# Patient Record
Sex: Female | Born: 1955 | Race: Black or African American | Hispanic: No | Marital: Single | Smoking: Never smoker
Health system: Southern US, Community
[De-identification: ages and names within clinical notes are randomized; demographics above are authoritative.]

## PROBLEM LIST (undated history)

## (undated) DIAGNOSIS — R52 Pain, unspecified: Secondary | ICD-10-CM

## (undated) DIAGNOSIS — R079 Chest pain, unspecified: Secondary | ICD-10-CM

## (undated) DIAGNOSIS — J45909 Unspecified asthma, uncomplicated: Secondary | ICD-10-CM

## (undated) DIAGNOSIS — I2692 Saddle embolus of pulmonary artery without acute cor pulmonale: Secondary | ICD-10-CM

## (undated) DIAGNOSIS — E041 Nontoxic single thyroid nodule: Secondary | ICD-10-CM

## (undated) DIAGNOSIS — H409 Unspecified glaucoma: Secondary | ICD-10-CM

## (undated) DIAGNOSIS — R569 Unspecified convulsions: Secondary | ICD-10-CM

## (undated) DIAGNOSIS — R0602 Shortness of breath: Secondary | ICD-10-CM

## (undated) DIAGNOSIS — M1990 Unspecified osteoarthritis, unspecified site: Secondary | ICD-10-CM

## (undated) DIAGNOSIS — E669 Obesity, unspecified: Secondary | ICD-10-CM

## (undated) HISTORY — DX: Unspecified glaucoma: H40.9

## (undated) HISTORY — DX: Pain, unspecified: R52

## (undated) HISTORY — DX: Nontoxic single thyroid nodule: E04.1

## (undated) HISTORY — DX: Shortness of breath: R06.02

## (undated) HISTORY — DX: Saddle embolus of pulmonary artery without acute cor pulmonale: I26.92

## (undated) HISTORY — DX: Unspecified osteoarthritis, unspecified site: M19.90

## (undated) HISTORY — DX: Obesity, unspecified: E66.9

## (undated) HISTORY — DX: Chest pain, unspecified: R07.9

## (undated) HISTORY — DX: Unspecified asthma, uncomplicated: J45.909

## (undated) HISTORY — DX: Unspecified convulsions: R56.9

---

## 2005-01-13 ENCOUNTER — Other Ambulatory Visit: Admission: RE | Admit: 2005-01-13 | Discharge: 2005-01-13 | Payer: Self-pay | Admitting: Obstetrics and Gynecology

## 2008-07-23 ENCOUNTER — Ambulatory Visit (HOSPITAL_COMMUNITY): Admission: RE | Admit: 2008-07-23 | Discharge: 2008-07-23 | Payer: Self-pay | Admitting: Obstetrics & Gynecology

## 2012-06-07 ENCOUNTER — Ambulatory Visit (INDEPENDENT_AMBULATORY_CARE_PROVIDER_SITE_OTHER): Payer: PRIVATE HEALTH INSURANCE | Admitting: Physician Assistant

## 2012-06-07 VITALS — BP 118/84 | HR 93 | Temp 98.0°F | Resp 18 | Ht 64.5 in | Wt 222.0 lb

## 2012-06-07 DIAGNOSIS — H5440 Blindness, one eye, unspecified eye: Secondary | ICD-10-CM

## 2012-06-07 DIAGNOSIS — M25559 Pain in unspecified hip: Secondary | ICD-10-CM

## 2012-06-07 DIAGNOSIS — M25569 Pain in unspecified knee: Secondary | ICD-10-CM

## 2012-06-07 DIAGNOSIS — H409 Unspecified glaucoma: Secondary | ICD-10-CM | POA: Insufficient documentation

## 2012-06-07 DIAGNOSIS — R51 Headache: Secondary | ICD-10-CM

## 2012-06-07 DIAGNOSIS — R110 Nausea: Secondary | ICD-10-CM

## 2012-06-07 HISTORY — DX: Unspecified glaucoma: H40.9

## 2012-06-07 LAB — POCT CBC
HCT, POC: 42.9 % (ref 37.7–47.9)
Lymph, poc: 2.4 (ref 0.6–3.4)
MCHC: 30.1 g/dL — AB (ref 31.8–35.4)
MCV: 78.8 fL — AB (ref 80–97)
MID (cbc): 0.85 (ref 0–0.9)
POC Granulocyte: 4.7 (ref 2–6.9)
POC LYMPH PERCENT: 31.7 %L (ref 10–50)
Platelet Count, POC: 270 10*3/uL (ref 142–424)
RDW, POC: 16.2 %

## 2012-06-07 LAB — COMPREHENSIVE METABOLIC PANEL
AST: 13 U/L (ref 0–37)
Alkaline Phosphatase: 97 U/L (ref 39–117)
BUN: 14 mg/dL (ref 6–23)
Glucose, Bld: 107 mg/dL — ABNORMAL HIGH (ref 70–99)
Sodium: 143 mEq/L (ref 135–145)
Total Bilirubin: 0.3 mg/dL (ref 0.3–1.2)

## 2012-06-07 LAB — POCT SEDIMENTATION RATE: POCT SED RATE: 35 mm/hr — AB (ref 0–22)

## 2012-06-07 MED ORDER — MELOXICAM 7.5 MG PO TABS: 7.5000 mg | ORAL_TABLET | Freq: Every day | ORAL | Status: DC

## 2012-06-07 MED ORDER — PROMETHAZINE HCL 12.5 MG PO TABS: 12.5000 mg | ORAL_TABLET | Freq: Four times a day (QID) | ORAL | Status: DC | PRN

## 2012-06-07 MED ORDER — FLUTICASONE PROPIONATE 50 MCG/ACT NA SUSP: 2.0000 | Freq: Every day | NASAL | Status: DC

## 2012-06-07 MED ORDER — KETOROLAC TROMETHAMINE 60 MG/2ML IM SOLN
60.0000 mg | Freq: Once | INTRAMUSCULAR | Status: AC
  Administered 2012-06-07: 60 mg via INTRAMUSCULAR

## 2012-06-07 NOTE — Progress Notes (Signed)
Subjective:    Patient ID: Barbara Wells, female    DOB: Apr 16, 1955, 57 y.o.   MRN: [id number]  HPI 57 yr old AAF presents with multiple issues.  She has a R sided headache.  Pain in R hip area, B wrists, B knee pain, slight nausea, and "feels gassy." she has been having these symptoms X2 days.  She has not seen her PCP in >1year.  She had a loose BM last night, but no def diarrhea.  No vomiting.  She works with patients. She denies any injuries or tick bites. This is not an unusual headach for her.   Review of Systems  All other systems reviewed and are negative.       Objective:   Physical Exam  Nursing note and vitals reviewed. Constitutional: She is oriented to person, place, and time. She appears well-developed and well-nourished.  HENT:  Head: Normocephalic and atraumatic.  Right Ear: External ear normal.  Left Ear: External ear normal.  Mouth/Throat: No oropharyngeal exudate.  Turbinates enlarged and boggy.  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   Pulmonary/Chest: Effort normal and breath sounds normal.  Musculoskeletal:  Crepitus knees B, but joints are stable.  R hip stable and no specific abnormality noted.  ROM appropriate for age and deconditioned state. B wrists with normal exams.  Neurological: She is alert and oriented to person, place, and time.  Skin: Skin is warm and dry.  Psychiatric: She has a normal mood and affect. Her behavior is normal.      Results for orders placed in visit on 06/07/12  POCT CBC      Result Value Range   WBC 7.6  4.6 - 10.2 K/uL   Lymph, poc 2.4  0.6 - 3.4   POC LYMPH PERCENT 31.7  10 - 50 %L   MID (cbc) 0.85  0 - 0.9   POC MID % 6.9  0 - 12 %M   POC Granulocyte 4.7  2 - 6.9   Granulocyte percent 61.4  37 - 80 %G   RBC 5.44  4.04 - 5.48 M/uL   Hemoglobin 12.9  12.2 - 16.2 g/dL   HCT, POC 27.2  53.6 - 47.9 %   MCV 78.8 (*) 80 - 97 fL    MCH, POC 23.7 (*) 27 - 31.2 pg   MCHC 30.1 (*) 31.8 - 35.4 g/dL   RDW, POC 64.4     Platelet Count, POC 270  142 - 424 K/uL   MPV 7.8  0 - 99.8 fL       Assessment & Plan:  Headache and diffuse joint pain Nausea and intestinal gas with loose BMs. Meds ordered this encounter  Medications  . acetaminophen (TYLENOL) 500 MG tablet    Sig: Take 1,000 mg by mouth every 6 (six) hours as needed for pain.  Marland Kitchen ketorolac (TORADOL) injection 60 mg    Sig:   . meloxicam (MOBIC) 7.5 MG tablet    Sig: Take 1 tablet (7.5 mg total) by mouth daily. Prn pain    Dispense:  60 tablet    Refill:  0    Order Specific Question:  Supervising Nimra Puccinelli    Answer:  DOOLITTLE, ROBERT P [[id number]]  . fluticasone (FLONASE) 50 MCG/ACT nasal spray    Sig: Place 2 sprays into the nose daily.    Dispense:  16 g    Refill:  6  Order Specific Question:  Supervising Arabela Basaldua    Answer:  DOOLITTLE, ROBERT P [[id number]]  . promethazine (PHENERGAN) 12.5 MG tablet    Sig: Take 1 tablet (12.5 mg total) by mouth every 6 (six) hours as needed for nausea. Or dizziness    Dispense:  30 tablet    Refill:  0    Order Specific Question:  Supervising Janai Maudlin    Answer:  DOOLITTLE, ROBERT P [[id number]]   Patient is having less pain upon dismissal.

## 2012-06-07 NOTE — Patient Instructions (Signed)
Fluids and rest.  I am checking your labs.  RTC if anything worsens.

## 2012-06-08 LAB — VITAMIN D 25 HYDROXY (VIT D DEFICIENCY, FRACTURES): Vit D, 25-Hydroxy: 10 ng/mL — ABNORMAL LOW (ref 30–89)

## 2012-06-11 MED ORDER — ERGOCALCIFEROL 1.25 MG (50000 UT) PO CAPS: 50000.0000 [IU] | ORAL_CAPSULE | ORAL | Status: DC

## 2012-06-11 NOTE — Addendum Note (Signed)
Addended by: Anders Simmonds on: 06/11/2012 09:00 AM   Modules accepted: Orders

## 2012-06-12 ENCOUNTER — Telehealth: Payer: Self-pay | Admitting: Radiology

## 2012-06-12 NOTE — Telephone Encounter (Signed)
Patient is advised of lab results. She will return to clinic to discuss.

## 2012-07-25 ENCOUNTER — Encounter: Payer: PRIVATE HEALTH INSURANCE | Admitting: Physician Assistant

## 2016-05-11 ENCOUNTER — Ambulatory Visit: Payer: Self-pay | Admitting: Family Medicine

## 2016-05-31 ENCOUNTER — Ambulatory Visit (INDEPENDENT_AMBULATORY_CARE_PROVIDER_SITE_OTHER): Payer: PRIVATE HEALTH INSURANCE | Admitting: Family Medicine

## 2016-05-31 ENCOUNTER — Encounter: Payer: Self-pay | Admitting: Family Medicine

## 2016-05-31 VITALS — BP 130/60 | HR 102 | Temp 98.6°F | Ht 65.0 in | Wt 210.2 lb

## 2016-05-31 DIAGNOSIS — R739 Hyperglycemia, unspecified: Secondary | ICD-10-CM

## 2016-05-31 DIAGNOSIS — Z1211 Encounter for screening for malignant neoplasm of colon: Secondary | ICD-10-CM

## 2016-05-31 DIAGNOSIS — Z0000 Encounter for general adult medical examination without abnormal findings: Secondary | ICD-10-CM | POA: Diagnosis not present

## 2016-05-31 DIAGNOSIS — E66811 Obesity, class 1: Secondary | ICD-10-CM

## 2016-05-31 DIAGNOSIS — Z114 Encounter for screening for human immunodeficiency virus [HIV]: Secondary | ICD-10-CM | POA: Diagnosis not present

## 2016-05-31 DIAGNOSIS — Z1159 Encounter for screening for other viral diseases: Secondary | ICD-10-CM | POA: Diagnosis not present

## 2016-05-31 DIAGNOSIS — Z7689 Persons encountering health services in other specified circumstances: Secondary | ICD-10-CM | POA: Diagnosis not present

## 2016-05-31 DIAGNOSIS — Z0100 Encounter for examination of eyes and vision without abnormal findings: Secondary | ICD-10-CM | POA: Diagnosis not present

## 2016-05-31 DIAGNOSIS — J45909 Unspecified asthma, uncomplicated: Secondary | ICD-10-CM | POA: Diagnosis not present

## 2016-05-31 DIAGNOSIS — E669 Obesity, unspecified: Secondary | ICD-10-CM

## 2016-05-31 HISTORY — DX: Unspecified asthma, uncomplicated: J45.909

## 2016-05-31 HISTORY — DX: Obesity, unspecified: E66.9

## 2016-05-31 HISTORY — DX: Obesity, class 1: E66.811

## 2016-05-31 LAB — CBC
HCT: 37.2 % (ref 35.0–45.0)
Hemoglobin: 11.9 g/dL (ref 11.7–15.5)
MCH: 25.4 pg — AB (ref 27.0–33.0)
MCHC: 32 g/dL (ref 32.0–36.0)
MCV: 79.3 fL — AB (ref 80.0–100.0)
MPV: 9.3 fL (ref 7.5–12.5)
PLATELETS: 211 10*3/uL (ref 140–400)
RBC: 4.69 MIL/uL (ref 3.80–5.10)
RDW: 16.4 % — AB (ref 11.0–15.0)
WBC: 6.9 10*3/uL (ref 3.8–10.8)

## 2016-05-31 LAB — POCT GLYCOSYLATED HEMOGLOBIN (HGB A1C): Hemoglobin A1C: 6.1

## 2016-05-31 MED ORDER — ALBUTEROL SULFATE HFA 108 (90 BASE) MCG/ACT IN AERS: 2.0000 | INHALATION_SPRAY | Freq: Four times a day (QID) | RESPIRATORY_TRACT | 0 refills | Status: DC | PRN

## 2016-05-31 NOTE — Progress Notes (Signed)
    Subjective:  Barbara AguasSarah E Digirolamo is a 61 y.o. female who presents [facility] the [facility] today to establish care  HPI:  Patient is here today to establish care at our clinic. Her past medical history includes glaucoma of the right eye, seasonal allergies, asthma and foot cramps. Her only complaint is that she is having increasing difficulties reading small print with her current prescription. The last time she saw an ophthalmologist was several years ago. She denies any chest pain, shortness of breath, nausea, vomiting, diarrhea, fevers or chills. Denies any urinary symptoms, vaginal bleeding or problems with bowel movements. She has never had a colonoscopy and reports that she had a mammogram and Pap smear within the last 2 years both of which were normal. I do not have access to these records.     PMH: Glaucoma of the right eye, seasonal allergies Tobacco use: Never smoked Medication: reviewed and updated ROS: see HPI   Objective:  Physical Exam: BP 130/60   Pulse (!) 102   Temp 98.6 F (37 C) (Oral)   Ht 5\' 5"  (1.651 m)   Wt 210 lb 3.2 oz (95.3 kg)   SpO2 98%   BMI 34.98 kg/m   Gen: 61 year old female in NAD, resting comfortably CV: RRR with no murmurs appreciated Pulm: NWOB, CTAB with no crackles, wheezes, or rhonchi GI: Normal bowel sounds present. Soft, Nontender, Nondistended. MSK: no edema, cyanosis, or clubbing noted Skin: warm, dry Neuro: grossly normal, moves all extremities Psych: Normal affect and thought content  Results for orders placed or performed in visit on 05/31/16 (from the past 72 hour(s))  POCT glycosylated hemoglobin (Hb A1C)     Status: None   Collection Time: 05/31/16  4:07 PM  Result Value Ref Range   Hemoglobin A1C 6.1      Assessment/Plan:  Asthma Reports history of asthma and that she will occassionally use a "red" inhaler.  Does not use controller medication.  Not a current smoker, never been hospitalized and never been intubated.  - refilled  albuterol - continue to monitor  Obesity (BMI 30.0-34.9) Patient BMI is 34.5.  States that she would like to lose some weight take better care of her health. Do not have baseline labs with patient this point. Discussed with her that after I know her cholesterol and hemoglobin A1c that we can discuss some plans for weight loss.  - Follow-up panel and hemoglobin A1c  Establish care/health maintenance:  Most recent labs for this patient were from 2014.  Has ever had a colonoscopy and is due for an eye exam as well. Pap smear and mammogram are up-to-date per patient.  - Gastroenterology referral for colonoscopy - Ophthalmology referral for regular eye exam - Lipid panel - Hemoglobin A1c - CMP - CBC

## 2016-05-31 NOTE — Assessment & Plan Note (Signed)
Reports history of asthma and that she will occassionally use a "red" inhaler.  Does not use controller medication.  Not a current smoker, never been hospitalized and never been intubated.  - refilled albuterol - continue to monitor

## 2016-05-31 NOTE — Patient Instructions (Signed)
You were seen today in clinic for a visit to establish care.  I have checked some blood work to see what your cholesterol and blood sugars are.  I have also referred you to a gastroenterology doctor for a colonoscopy and an eye doctor.   It was very nice meeting you.  I will contact you with the results of your blood work.   Take care, Barbara Wells L. Myrtie SomanWarden, MD [facility] Resident PGY-1 05/31/2016 3:39 PM

## 2016-05-31 NOTE — Assessment & Plan Note (Signed)
Patient BMI is 34.5.  States that she would like to lose some weight take better care of her health. Do not have baseline labs with patient this point. Discussed with her that after I know her cholesterol and hemoglobin A1c that we can discuss some plans for weight loss.  - Follow-up panel and hemoglobin A1c

## 2016-06-01 LAB — COMPLETE METABOLIC PANEL WITH GFR
ALT: 13 U/L (ref 6–29)
AST: 13 U/L (ref 10–35)
Albumin: 3.8 g/dL (ref 3.6–5.1)
Alkaline Phosphatase: 103 U/L (ref 33–130)
BILIRUBIN TOTAL: 0.3 mg/dL (ref 0.2–1.2)
BUN: 17 mg/dL (ref 7–25)
CO2: 24 mmol/L (ref 20–31)
Calcium: 9.1 mg/dL (ref 8.6–10.4)
Chloride: 109 mmol/L (ref 98–110)
Creat: 0.58 mg/dL (ref 0.50–0.99)
Glucose, Bld: 110 mg/dL — ABNORMAL HIGH (ref 65–99)
Potassium: 4.1 mmol/L (ref 3.5–5.3)
Sodium: 145 mmol/L (ref 135–146)
TOTAL PROTEIN: 6.1 g/dL (ref 6.1–8.1)

## 2016-06-01 LAB — LIPID PANEL
Cholesterol: 165 mg/dL (ref <200)
HDL: 40 mg/dL — AB (ref >50)
LDL CALC: 94 mg/dL (ref <100)
TRIGLYCERIDES: 157 mg/dL — AB (ref <150)
Total CHOL/HDL Ratio: 4.1 Ratio (ref <5.0)
VLDL: 31 mg/dL — AB (ref <30)

## 2016-06-01 LAB — HEPATITIS C ANTIBODY: HCV AB: NEGATIVE

## 2016-06-01 LAB — HIV ANTIBODY (ROUTINE TESTING W REFLEX): HIV: NONREACTIVE

## 2016-06-05 ENCOUNTER — Other Ambulatory Visit: Payer: Self-pay | Admitting: Family Medicine

## 2016-06-05 MED ORDER — ATORVASTATIN CALCIUM 20 MG PO TABS: 20.0000 mg | ORAL_TABLET | Freq: Every day | ORAL | 3 refills | Status: DC

## 2016-07-06 ENCOUNTER — Encounter: Payer: Self-pay | Admitting: Family Medicine

## 2016-11-25 ENCOUNTER — Encounter: Payer: Self-pay | Admitting: Family Medicine

## 2016-11-25 ENCOUNTER — Ambulatory Visit (INDEPENDENT_AMBULATORY_CARE_PROVIDER_SITE_OTHER): Payer: PRIVATE HEALTH INSURANCE | Admitting: Family Medicine

## 2016-11-25 ENCOUNTER — Encounter: Payer: Self-pay | Admitting: *Deleted

## 2016-11-25 VITALS — BP 112/58 | HR 82 | Temp 98.6°F | Ht 65.0 in | Wt 216.4 lb

## 2016-11-25 DIAGNOSIS — R300 Dysuria: Secondary | ICD-10-CM | POA: Diagnosis not present

## 2016-11-25 LAB — POCT URINALYSIS DIP (MANUAL ENTRY)
BILIRUBIN UA: NEGATIVE
GLUCOSE UA: NEGATIVE mg/dL
NITRITE UA: NEGATIVE
Protein Ur, POC: NEGATIVE mg/dL
RBC UA: NEGATIVE
SPEC GRAV UA: 1.02 (ref 1.010–1.025)
Urobilinogen, UA: 0.2 E.U./dL
pH, UA: 7.5 (ref 5.0–8.0)

## 2016-11-25 LAB — POCT UA - MICROSCOPIC ONLY

## 2016-11-25 NOTE — Progress Notes (Signed)
      Subjective:  Barbara Wells is a 61 y.o. female who presents to the [facility] today with a chief complaint of burning with urination.   HPI:  DYSURIA  Pain urinating started one month Pain is: slight burning Medications tried: none Any antibiotics in the last 30 days: none More than 3 UTIs in the last 12 months: none STD exposure: herpes from first husband back in 1980 Possibly pregnant: none  Symptoms Urgency: yes Frequency: yes Blood in urine: none Pain in back: left side Fever: none Vaginal discharge: none Mouth Ulcers: none  Review of Symptoms - see HPI PMH - Smoking status noted.    Tobacco use reviewed Medication: reviewed and updated ROS: see HPI   Objective:  Physical Exam: BP (!) 112/58   Pulse 82   Temp 98.6 F (37 C) (Oral)   Ht 5\' 5"  (1.651 m)   Wt 216 lb 6.4 oz (98.2 kg)   SpO2 98%   BMI 36.01 kg/m   Gen: 61 year old female in NAD, resting comfortably CV: RRR with no murmurs appreciated Pulm: NWOB, CTAB with no crackles, wheezes, or rhonchi GI: Normal bowel sounds present. Soft, Nontender, Non-distended, no suprapubic tenderness MSK: no edema, cyanosis, or clubbing noted, no CVA tenderness Skin: warm, dry Neuro: grossly normal, moves all extremities Psych: Normal affect and thought content  No results found for this or any previous visit (from the past 72 hour(s)).   Assessment/Plan:  Increased urinary frequency with minimal dysuria Symptomatic for the past month without any vaginal discharge, fevers, chills, CVA tenderness or history of kidney stones. UA was within normal limits.  - Return precautions discussed with patient

## 2016-11-25 NOTE — Patient Instructions (Signed)
Barbara Wells, you were seen today for a cold and some increased frequency of urination and some burning.  This could represent a urine infection.  I will be checking your urine.  I will give you a call today if it looks infected and will start on a medication.  It was very nice seeing you today, Barbara Boom L. Myrtie Soman, MD [facility] Resident PGY-2 11/25/2016 12:00 PM

## 2017-04-02 ENCOUNTER — Emergency Department (HOSPITAL_COMMUNITY): Payer: PRIVATE HEALTH INSURANCE

## 2017-04-02 ENCOUNTER — Encounter (HOSPITAL_COMMUNITY): Payer: Self-pay | Admitting: Emergency Medicine

## 2017-04-02 ENCOUNTER — Inpatient Hospital Stay (HOSPITAL_COMMUNITY)
Admission: EM | Admit: 2017-04-02 | Discharge: 2017-04-05 | DRG: 176 | Disposition: A | Discharge status: Home / Self Care | Payer: PRIVATE HEALTH INSURANCE | Attending: Family Medicine | Admitting: Family Medicine

## 2017-04-02 ENCOUNTER — Inpatient Hospital Stay (HOSPITAL_COMMUNITY): Payer: PRIVATE HEALTH INSURANCE

## 2017-04-02 DIAGNOSIS — R52 Pain, unspecified: Secondary | ICD-10-CM

## 2017-04-02 DIAGNOSIS — E041 Nontoxic single thyroid nodule: Secondary | ICD-10-CM

## 2017-04-02 DIAGNOSIS — Z79899 Other long term (current) drug therapy: Secondary | ICD-10-CM

## 2017-04-02 DIAGNOSIS — R569 Unspecified convulsions: Secondary | ICD-10-CM

## 2017-04-02 DIAGNOSIS — R0902 Hypoxemia: Secondary | ICD-10-CM | POA: Diagnosis present

## 2017-04-02 DIAGNOSIS — Z9114 Patient's other noncompliance with medication regimen: Secondary | ICD-10-CM

## 2017-04-02 DIAGNOSIS — Z888 Allergy status to other drugs, medicaments and biological substances status: Secondary | ICD-10-CM

## 2017-04-02 DIAGNOSIS — G40409 Other generalized epilepsy and epileptic syndromes, not intractable, without status epilepticus: Secondary | ICD-10-CM | POA: Diagnosis present

## 2017-04-02 DIAGNOSIS — Z6834 Body mass index (BMI) 34.0-34.9, adult: Secondary | ICD-10-CM

## 2017-04-02 DIAGNOSIS — E669 Obesity, unspecified: Secondary | ICD-10-CM | POA: Diagnosis present

## 2017-04-02 DIAGNOSIS — Z8249 Family history of ischemic heart disease and other diseases of the circulatory system: Secondary | ICD-10-CM

## 2017-04-02 DIAGNOSIS — R0602 Shortness of breath: Secondary | ICD-10-CM

## 2017-04-02 DIAGNOSIS — I2692 Saddle embolus of pulmonary artery without acute cor pulmonale: Principal | ICD-10-CM | POA: Diagnosis present

## 2017-04-02 DIAGNOSIS — H409 Unspecified glaucoma: Secondary | ICD-10-CM | POA: Diagnosis present

## 2017-04-02 DIAGNOSIS — R079 Chest pain, unspecified: Secondary | ICD-10-CM

## 2017-04-02 DIAGNOSIS — M79606 Pain in leg, unspecified: Secondary | ICD-10-CM | POA: Diagnosis present

## 2017-04-02 DIAGNOSIS — H5461 Unqualified visual loss, right eye, normal vision left eye: Secondary | ICD-10-CM | POA: Diagnosis present

## 2017-04-02 DIAGNOSIS — R000 Tachycardia, unspecified: Secondary | ICD-10-CM | POA: Diagnosis present

## 2017-04-02 DIAGNOSIS — J45909 Unspecified asthma, uncomplicated: Secondary | ICD-10-CM | POA: Diagnosis present

## 2017-04-02 DIAGNOSIS — Z9981 Dependence on supplemental oxygen: Secondary | ICD-10-CM

## 2017-04-02 DIAGNOSIS — E049 Nontoxic goiter, unspecified: Secondary | ICD-10-CM | POA: Diagnosis present

## 2017-04-02 DIAGNOSIS — Z86711 Personal history of pulmonary embolism: Secondary | ICD-10-CM | POA: Diagnosis present

## 2017-04-02 DIAGNOSIS — E559 Vitamin D deficiency, unspecified: Secondary | ICD-10-CM | POA: Diagnosis present

## 2017-04-02 DIAGNOSIS — I2602 Saddle embolus of pulmonary artery with acute cor pulmonale: Secondary | ICD-10-CM

## 2017-04-02 DIAGNOSIS — R7303 Prediabetes: Secondary | ICD-10-CM | POA: Diagnosis present

## 2017-04-02 DIAGNOSIS — R32 Unspecified urinary incontinence: Secondary | ICD-10-CM | POA: Diagnosis present

## 2017-04-02 DIAGNOSIS — E079 Disorder of thyroid, unspecified: Secondary | ICD-10-CM

## 2017-04-02 HISTORY — DX: Saddle embolus of pulmonary artery without acute cor pulmonale: I26.92

## 2017-04-02 LAB — CBC WITH DIFFERENTIAL/PLATELET
BASOS ABS: 0 10*3/uL (ref 0.0–0.1)
BASOS PCT: 0 %
EOS ABS: 0 10*3/uL (ref 0.0–0.7)
Eosinophils Relative: 0 %
HEMATOCRIT: 39.6 % (ref 36.0–46.0)
Hemoglobin: 12.4 g/dL (ref 12.0–15.0)
Lymphocytes Relative: 15 %
Lymphs Abs: 1.7 10*3/uL (ref 0.7–4.0)
MCH: 24.6 pg — ABNORMAL LOW (ref 26.0–34.0)
MCHC: 31.3 g/dL (ref 30.0–36.0)
MCV: 78.6 fL (ref 78.0–100.0)
MONO ABS: 0.4 10*3/uL (ref 0.1–1.0)
MONOS PCT: 3 %
NEUTROS ABS: 9.7 10*3/uL — AB (ref 1.7–7.7)
NEUTROS PCT: 82 %
Platelets: 130 10*3/uL — ABNORMAL LOW (ref 150–400)
RBC: 5.04 MIL/uL (ref 3.87–5.11)
RDW: 15.2 % (ref 11.5–15.5)
WBC: 11.8 10*3/uL — ABNORMAL HIGH (ref 4.0–10.5)

## 2017-04-02 LAB — I-STAT TROPONIN, ED
Troponin i, poc: 0.18 ng/mL (ref 0.00–0.08)
Troponin i, poc: 0.24 ng/mL (ref 0.00–0.08)

## 2017-04-02 LAB — COMPREHENSIVE METABOLIC PANEL
ALBUMIN: 3.5 g/dL (ref 3.5–5.0)
ALT: 14 U/L (ref 14–54)
ANION GAP: 11 (ref 5–15)
AST: 19 U/L (ref 15–41)
Alkaline Phosphatase: 90 U/L (ref 38–126)
BUN: 12 mg/dL (ref 6–20)
CHLORIDE: 107 mmol/L (ref 101–111)
CO2: 17 mmol/L — AB (ref 22–32)
Calcium: 8.4 mg/dL — ABNORMAL LOW (ref 8.9–10.3)
Creatinine, Ser: 0.68 mg/dL (ref 0.44–1.00)
GFR calc Af Amer: >60 mL/min (ref >60)
GFR calc non Af Amer: >60 mL/min (ref >60)
GLUCOSE: 192 mg/dL — AB (ref 65–99)
POTASSIUM: 3.4 mmol/L — AB (ref 3.5–5.1)
SODIUM: 135 mmol/L (ref 135–145)
Total Bilirubin: 0.5 mg/dL (ref 0.3–1.2)
Total Protein: 6 g/dL — ABNORMAL LOW (ref 6.5–8.1)

## 2017-04-02 LAB — ETHANOL: Alcohol, Ethyl (B): <10 mg/dL (ref <10)

## 2017-04-02 LAB — RAPID URINE DRUG SCREEN, HOSP PERFORMED
AMPHETAMINES: NOT DETECTED
BARBITURATES: NOT DETECTED
BENZODIAZEPINES: NOT DETECTED
COCAINE: NOT DETECTED
Opiates: NOT DETECTED
TETRAHYDROCANNABINOL: NOT DETECTED

## 2017-04-02 LAB — D-DIMER, QUANTITATIVE (NOT AT ARMC): D DIMER QUANT: 13.36 ug{FEU}/mL — AB (ref 0.00–0.50)

## 2017-04-02 LAB — MAGNESIUM: MAGNESIUM: 1.9 mg/dL (ref 1.7–2.4)

## 2017-04-02 MED ORDER — IOPAMIDOL (ISOVUE-370) INJECTION 76%
INTRAVENOUS | Status: AC
  Administered 2017-04-02: 100 mL
  Filled 2017-04-02: qty 100

## 2017-04-02 MED ORDER — HEPARIN BOLUS VIA INFUSION
5000.0000 [IU] | Freq: Once | INTRAVENOUS | Status: AC
  Administered 2017-04-02: 5000 [IU] via INTRAVENOUS
  Filled 2017-04-02: qty 5000

## 2017-04-02 MED ORDER — MORPHINE SULFATE (PF) 4 MG/ML IV SOLN
4.0000 mg | Freq: Once | INTRAVENOUS | Status: DC
  Filled 2017-04-02: qty 1

## 2017-04-02 MED ORDER — PROMETHAZINE HCL 25 MG/ML IJ SOLN
12.5000 mg | Freq: Once | INTRAMUSCULAR | Status: AC
  Administered 2017-04-02: 12.5 mg via INTRAVENOUS
  Filled 2017-04-02: qty 1

## 2017-04-02 MED ORDER — LATANOPROST 0.005 % OP SOLN
1.0000 [drp] | Freq: Every day | OPHTHALMIC | Status: DC
  Administered 2017-04-03 – 2017-04-04 (×2): 1 [drp] via OPHTHALMIC
  Filled 2017-04-02: qty 2.5

## 2017-04-02 MED ORDER — SODIUM CHLORIDE 0.9 % IV BOLUS (SEPSIS)
500.0000 mL | Freq: Once | INTRAVENOUS | Status: AC
  Administered 2017-04-02: 500 mL via INTRAVENOUS

## 2017-04-02 MED ORDER — ALBUTEROL SULFATE HFA 108 (90 BASE) MCG/ACT IN AERS: 2.0000 | INHALATION_SPRAY | Freq: Four times a day (QID) | RESPIRATORY_TRACT | Status: DC | PRN

## 2017-04-02 MED ORDER — HEPARIN (PORCINE) IN NACL 100-0.45 UNIT/ML-% IJ SOLN
1350.0000 [IU]/h | INTRAMUSCULAR | Status: DC
  Administered 2017-04-02: 1400 [IU]/h via INTRAVENOUS
  Administered 2017-04-03: 1300 [IU]/h via INTRAVENOUS
  Administered 2017-04-04 – 2017-04-05 (×2): 1350 [IU]/h via INTRAVENOUS
  Filled 2017-04-02 (×4): qty 250

## 2017-04-02 MED ORDER — LORAZEPAM 2 MG/ML IJ SOLN
0.5000 mg | Freq: Once | INTRAMUSCULAR | Status: AC
  Administered 2017-04-02: 0.5 mg via INTRAVENOUS
  Filled 2017-04-02: qty 1

## 2017-04-02 NOTE — ED Notes (Addendum)
Informed Pt against walking to the bathroom without being hooked up to monitor, Pt decided to walk to the bathroom without the monitor anyway.

## 2017-04-02 NOTE — ED Notes (Signed)
Delay in lab draw,  Pt not in room 

## 2017-04-02 NOTE — ED Notes (Signed)
PA at the bedside.

## 2017-04-02 NOTE — ED Notes (Signed)
Writer notified EDP and PA of abnormal I stat trop.

## 2017-04-02 NOTE — ED Notes (Signed)
Writer notified EDP Miller of abnormal I stat trop result

## 2017-04-02 NOTE — ED Provider Notes (Signed)
Patient is a 61 year old female, she presents to the hospital today with a complaint of new onset seizures.  The patient does not recall either of these events but reportedly according to the paramedics per the family had 2 witnessed tonic-clonic seizures at home.  The patient states that she works third shift, she had woken up and was feeling like her normal self but overall was persistently and gradually worsening feeling more weak throughout the day.  She then had a witnessed seizure by family, she was slow to come around but she did and then had another seizure.  That is when the paramedics were called.  They noted that her blood sugar was normal, her vital signs show tachycardia and a slight hypoxia and in route to the hospital the patient did have some chest pain.  She was given nitroglycerin and aspirin by the time she arrived she has very little pain.  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  Abdomen is soft and nontender, lungs are clear, heart is clear.  New onset seizure EKG normal CT head Seizure precautions.  I have personally viewed and interpreted the CT scan and find there to be a very large saddle embolus which is covering large vessels in bilateral lungs, a small amount of clot is bridging through the main pulmonary artery.  I have paged critical care to discuss his case and asked pharmacy to dose to heparin dose.  The patient is critically ill and unfortunately is continuing to get up and down to go to the bathroom.  I feel that this not safe for her to be ambulating with a large PE and likely needs to be on bedrest.  We will place a Foley catheter for this reason.  Discussed with the radiologist at 10:08 PM, he reports that there is a large saddle embolus.  There is right  heart strain.  I disussed the case with Critical Care physician as well as with [facility] on call resident the latter of who will admit  .Critical Care Performed by: Eber HongMiller, Alif Petrak, MD Authorized by: Eber HongMiller, Toua Stites, MD   Critical care provider statement:    Critical care time (minutes):  35   Critical care time was exclusive of:  Separately billable procedures and treating other patients   Critical care was necessary to treat or prevent imminent or life-threatening deterioration of the following conditions: massive pulmonary embolism.   Critical care was time spent personally by me on the following activities:  Blood draw for specimens, development of treatment plan with patient or surrogate, discussions with consultants, evaluation of patient's response to treatment, examination of patient, obtaining history from patient or surrogate, ordering and performing treatments and interventions, ordering and review of laboratory studies, ordering and review of radiographic studies, pulse oximetry, re-evaluation of patient's condition and review of old charts       EKG Interpretation  Date/Time:  Sunday April 02 2017 17:55:13 EST Ventricular Rate:  114 PR Interval:    QRS Duration: 76 QT Interval:  375 QTC Calculation: 517 R Axis:   81 Text Interpretation:  Sinus tachycardia Borderline right axis deviation Borderline T wave abnormalities Prolonged QT interval No old tracing to compare Confirmed by Eber HongMiller, Dent Plantz ([id number]) on 04/02/2017 6:00:31 PM        Medical screening  examination/treatment/procedure(s) were conducted as a shared visit with non-physician practitioner(s) and myself.  I personally evaluated the patient during the encounter.  Clinical Impression:   Final diagnoses:  Chest pain, unspecified type  Shortness of breath  Seizure-like activity (HCC)  Acute saddle pulmonary embolism with acute cor pulmonale (HCC)  Thyroid mass         Eber HongMiller, Violett Hobbs,  MD 04/03/17 [phone]

## 2017-04-02 NOTE — ED Provider Notes (Signed)
[facility] EMERGENCY DEPARTMENT Provider Note   CSN: [id number] Arrival date & time: 04/02/17  1751     History   Chief Complaint Chief Complaint  Patient presents with  . Chest Pain  . Seizures    HPI Barbara Wells is a 61 y.o. female.  HPI   She is 61 year old female who presented to ED to be evaluated for suspected seizures that occurred prior to arrival.  Patient had 2 witnessed episodes by her family members who state she was sitting in a chair, when she became nonresponsive, with her head turned to the right side, eyes deviated upward into the right, and was having tonic-clonic movements.  Son states her pupils were fixed during this episode, and she was groggy and confused after the episode. Urinary incontinence noted by EMS.  Since the episodes, pt complaining of mid to left sided chest pain and shortness of breath. Not on O2 at home and no h/o asthma. She also endorses nausea, vomiting, and that her pulse feels like it is racing.  She states she has had a racing pulse over the last few days, and noted her oxygen was low in the 80s today.  She denies any recent fevers, URI sxs, abdominal pain, diarrhea, constipation.  Patient states that she has intermittent swelling to her BLE, but this has not been persistent over the last few days. Patients family is stating that pt c/o leg pain for the last few days.   Patient received 324 ASA and 2 nitroglycerin in route.  Reports improvement, but not resolution of chest pain since receiving nitro.   Past Medical History:  Diagnosis Date  . Arthritis     Patient Active Problem List   Diagnosis Date Noted  . Left thyroid nodule   . Shortness of breath   . 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 Asthma 05/31/2016  . Obesity (BMI 30.0-34.9) 05/31/2016  . Glaucoma, right eye 06/07/2012    History reviewed. No pertinent surgical history.  OB History    No data available       Home Medications     Prior to Admission medications   Medication Sig Start Date End Date Taking? Authorizing Provider  acetaminophen (TYLENOL) 500 MG tablet Take 1,000 mg by mouth every 6 (six) hours as needed for headache (pain).    Yes [provider]  albuterol (PROVENTIL HFA;VENTOLIN HFA) 108 (90 Base) MCG/ACT inhaler Inhale 2 puffs into the lungs every 6 (six) hours as needed for wheezing or shortness of breath. 05/31/16  Yes Renne Musca, MD  ibuprofen (ADVIL,MOTRIN) 200 MG tablet Take 200 mg by mouth every 6 (six) hours as needed for headache (pain).   Yes [provider]  latanoprost (XALATAN) 0.005 % ophthalmic solution Place 1 drop into both eyes at bedtime. 12/28/16  Yes [provider]  Multiple Vitamin (MULTIVITAMIN WITH MINERALS) TABS tablet Take 1 tablet by mouth at bedtime.   Yes [provider]    Family History Family History  Problem Relation Age of Onset  . Stroke Mother   . Hypertension Mother   . Heart disease Father   . Kidney disease Brother   . Hernia Son   . ADD / ADHD Son     Social History Social History   Tobacco Use  . Smoking status: Never Smoker  . Smokeless tobacco: Never Used  Substance Use Topics  . Alcohol use: No  . Drug use: No     Allergies  Griseofulvin   Review of Systems Review of Systems  Constitutional: Negative for chills and fever.  HENT: Negative for congestion, ear pain, rhinorrhea and sore throat.   Eyes: Negative for pain and visual disturbance.  Respiratory: Positive for shortness of breath. Negative for cough and wheezing.   Cardiovascular: Positive for chest pain, palpitations and leg swelling (intermittent).  Gastrointestinal: Positive for nausea and vomiting. Negative for abdominal pain.  Genitourinary: Negative for dysuria and hematuria.  Musculoskeletal: Negative for arthralgias and back pain.  Skin: Negative for color change and rash.  Neurological: Positive for dizziness and  light-headedness. Negative for seizures, syncope, weakness and numbness.  All other systems reviewed and are negative.    Physical Exam Updated Vital Signs BP 95/67   Pulse 97   Temp 97.9 F (36.6 C) (Oral)   Resp (!) 25   Ht 5\' 5"  (1.651 m)   Wt 98 kg (216 lb)   SpO2 100%   BMI 35.94 kg/m   Physical Exam  Constitutional: She is oriented to person, place, and time. She appears well-developed and well-nourished. She appears distressed.  HENT:  Head: Normocephalic and atraumatic.  Eyes: Conjunctivae are normal.  Blind in right eye. Right pupil round and reactive to light  Neck: Neck supple.  Cardiovascular: Regular rhythm and intact distal pulses. Tachycardia present. Exam reveals no distant heart sounds.  No murmur heard. Pulmonary/Chest: Effort normal and breath sounds normal. No tachypnea. No respiratory distress.  Abdominal: Soft. There is no tenderness.  Musculoskeletal: She exhibits no edema.       Right lower leg: She exhibits no tenderness and no edema.       Left lower leg: She exhibits no tenderness and no edema.  Neurological: She is oriented to person, place, and time. No cranial nerve deficit.  Mental Status:  Pt appears tired, but is alert, thought content appropriate, able to give a coherent history. Speech fluent without evidence of aphasia. Able to follow 2 step commands without difficulty.  Cranial Nerves:  II: right pupil equal, round, reactive to light III,IV, VI: ptosis not present, extra-ocular motions intact bilaterally  V,VII: smile symmetric VIII: hearing grossly normal to voice  XI: bilateral shoulder shrug symmetric and strong XII: midline tongue extension without fassiculations Motor:  Normal tone. 5/5 strength of BUE and BLE major muscle groups including strong and equal grip strength and dorsiflexion/plantar flexion Sensory: light touch normal in all extremities. CV: 2+ DP/PT pulses   Skin: Skin is warm and dry.  Psychiatric: She has a  normal mood and affect.  Nursing note and vitals reviewed.    ED Treatments / Results  Labs (all labs ordered are listed, but only abnormal results are displayed) Labs Reviewed  CBC WITH DIFFERENTIAL/PLATELET - Abnormal; Notable for the following components:      Result Value   WBC 11.8 (*)    MCH 24.6 (*)    Platelets 130 (*)    Neutro Abs 9.7 (*)    All other components within normal limits  COMPREHENSIVE METABOLIC PANEL - Abnormal; Notable for the following components:   Potassium 3.4 (*)    CO2 17 (*)    Glucose, Bld 192 (*)    Calcium 8.4 (*)    Total Protein 6.0 (*)    All other components within normal limits  D-DIMER, QUANTITATIVE (NOT AT [facility]) - Abnormal; Notable for the following components:   D-Dimer, Quant 13.36 (*)    All other components within normal limits  HEMOGLOBIN A1C - Abnormal;  Notable for the following components:   Hgb A1c MFr Bld 6.2 (*)    All other components within normal limits  LIPID PANEL - Abnormal; Notable for the following components:   HDL 31 (*)    All other components within normal limits  I-STAT TROPONIN, ED - Abnormal; Notable for the following components:   Troponin i, poc 0.18 (*)    All other components within normal limits  I-STAT TROPONIN, ED - Abnormal; Notable for the following components:   Troponin i, poc 0.24 (*)    All other components within normal limits  ETHANOL  RAPID URINE DRUG SCREEN, HOSP PERFORMED  MAGNESIUM  HEPARIN LEVEL (UNFRACTIONATED)  CBC  TSH  TROPONIN I  TROPONIN I    EKG  EKG Interpretation  Date/Time:  Sunday April 02 2017 17:55:13 EST Ventricular Rate:  114 PR Interval:    QRS Duration: 76 QT Interval:  375 QTC Calculation: 517 R Axis:   81 Text Interpretation:  Sinus tachycardia Borderline right axis deviation Borderline T wave abnormalities Prolonged QT interval No old tracing to compare Confirmed by Eber HongMiller, Brian ([id number]) on 04/02/2017 6:00:31 PM       Radiology Ct Head Wo  Contrast  Result Date: 04/02/2017 CLINICAL DATA:  New nontraumatic seizures and urine incontinence. EXAM: CT HEAD WITHOUT CONTRAST TECHNIQUE: Contiguous axial images were obtained from the base of the skull through the vertex without intravenous contrast. COMPARISON:  None. FINDINGS: Brain: No evidence of acute infarction, hemorrhage, hydrocephalus, extra-axial collection or mass lesion/mass effect. Vascular: No hyperdense vessel or unexpected calcification. Skull: Normal. Negative for fracture or focal lesion. Sinuses/Orbits: No acute finding. Other: None. IMPRESSION: No acute intracranial abnormalities. Electronically Signed   By: Burman NievesWilliam  Stevens M.D.   On: 04/02/2017 18:47   Ct Angio Chest Pe W And/or Wo Contrast  Result Date: 04/02/2017 CLINICAL DATA:  Status post 2 seizures. Urinary incontinence. Diaphoresis and midsternal chest pain, acute onset. EXAM: CT ANGIOGRAPHY CHEST WITH CONTRAST TECHNIQUE: Multidetector CT imaging of the chest was performed using the standard protocol during bolus administration of intravenous contrast. Multiplanar CT image reconstructions and MIPs were obtained to evaluate the vascular anatomy. CONTRAST:  100mL ISOVUE-370 IOPAMIDOL (ISOVUE-370) INJECTION 76% COMPARISON:  Chest radiograph performed earlier today at 7:53 p.m. FINDINGS: Cardiovascular: A large saddle pulmonary embolus is noted, extending bilaterally into all lobes of both lungs. The RV/LV ratio is 1.35, compatible with right heart strain and at least submassive pulmonary embolus. The heart is normal in size. Scattered coronary artery calcifications are seen. The great vessels are grossly unremarkable. Mediastinum/Nodes: No mediastinal lymphadenopathy is seen. No pericardial effusion is identified. A large multilobulated mildly heterogeneous mass is seen arising at the left thyroid lobe, measuring 6.8 x 5.8 x 4.2 cm. Malignancy cannot be excluded. No axillary lymphadenopathy is seen. Lungs/Pleura: Mild bilateral  dependent subsegmental atelectasis is noted. The lungs are otherwise clear. No pleural effusion or pneumothorax is seen. Upper Abdomen: The visualized portions of the liver and spleen are unremarkable. The visualized portions of the pancreas, adrenal glands and kidneys are within normal limits. Musculoskeletal: No acute osseous abnormalities are identified. The visualized musculature is unremarkable in appearance. Review of the MIP images confirms the above findings. IMPRESSION: 1. Large saddle pulmonary embolus, extending bilaterally into all lobes of both lungs. CT evidence of right heart strain (RV/LV Ratio = 1.35) consistent with at least submassive (intermediate risk) PE. The presence of right heart strain has been associated with an increased risk of morbidity and mortality. Please activate  Code PE by paging [phone]. 2. Large multilobulated mildly heterogeneous mass arising at the left thyroid lobe, measuring 6.8 x 5.8 x 4.2 cm. Malignancy cannot be excluded. Recommend further evaluation with thyroid ultrasound. If patient is clinically hyperthyroid, consider nuclear medicine thyroid uptake and scan. 3. Mild bilateral dependent subsegmental atelectasis. Lungs otherwise clear. 4. Scattered coronary artery calcifications. Critical Value/emergent results were called by telephone at the time of interpretation on 04/02/2017 at 10:08 pm to Dr. Eber Hong, who verbally acknowledged these results. Electronically Signed   By: Roanna Raider M.D.   On: 04/02/2017 22:21   Dg Chest Port 1 View  Result Date: 04/02/2017 CLINICAL DATA:  Shortness of breath with movement.  Seizure today. EXAM: PORTABLE CHEST 1 VIEW COMPARISON:  None. FINDINGS: The heart size and mediastinal contours are within normal limits. Both lungs are clear. The visualized skeletal structures are unremarkable. IMPRESSION: No active cardiopulmonary disease. Electronically Signed   By: Sherian Rein M.D.   On: 04/02/2017 20:29   US  Thyroid  Result Date: 04/03/2017 CLINICAL DATA:  Goiter. Left thyroid nodule seen on CT today. Emergent evaluation is requested. EXAM: THYROID ULTRASOUND TECHNIQUE: Ultrasound examination of the thyroid gland and adjacent soft tissues was performed. COMPARISON:  CT chest 04/02/2017 FINDINGS: Parenchymal Echotexture: Thyroid parenchymal echotexture is diffusely heterogeneous. Isthmus: Measures 5 mm thickness.  No focal nodules. Right lobe: Measures 2.6 x 0.8 x 1.1 cm.  No focal nodules. Left lobe: Measures 7.4 x 4.8 x 6 cm. Left thyroid is diffusely enlarged and heterogeneous in appearance. No specific focal dominant nodules identified. Flow is demonstrated throughout the enlarged left thyroid gland. No thyroid calcifications are noted. _________________________________________________________ Estimated total number of nodules >/= 1 cm: 0 Number of spongiform nodules >/=  2 cm not described below (TR1): 0 Number of mixed cystic and solid nodules >/= 1.5 cm not described below (TR2): 0 _________________________________________________________ No discrete nodules are seen within the thyroid gland. IMPRESSION: The left thyroid is diffusely enlarged and heterogeneous. Is likely due to goiter. No focal dominant nodule identified. Due to size, consider six-month follow-up ultrasound study. The above is in keeping with the ACR TI-RADS recommendations - J Am Coll Radiol 2017;14:587-595. Electronically Signed   By: Burman Nieves M.D.   On: 04/03/2017 01:15    Procedures Procedures (including critical care time)  Medications Ordered in ED Medications  morphine 4 MG/ML injection 4 mg (4 mg Intravenous Refused 04/02/17 1942)  heparin ADULT infusion 100 units/mL (25000 units/256mL sodium chloride 0.45%) (1,400 Units/hr Intravenous New Bag/Given 04/02/17 2329)  albuterol (PROVENTIL HFA;VENTOLIN HFA) 108 (90 Base) MCG/ACT inhaler 2 puff (not administered)  latanoprost (XALATAN) 0.005 % ophthalmic solution 1 drop  (not administered)  LORazepam (ATIVAN) injection 0.5 mg (0.5 mg Intravenous Given 04/02/17 1811)  promethazine (PHENERGAN) injection 12.5 mg (12.5 mg Intravenous Given 04/02/17 1941)  sodium chloride 0.9 % bolus 500 mL (0 mLs Intravenous Stopped 04/02/17 2012)  iopamidol (ISOVUE-370) 76 % injection (100 mLs  Contrast Given 04/02/17 2146)  heparin bolus via infusion 5,000 Units (5,000 Units Intravenous Bolus from Bag 04/02/17 2329)     Initial Impression / Assessment and Plan / ED Course  I have reviewed the triage vital signs and the nursing notes.  Pertinent labs & imaging results that were available during my care of the patient were reviewed by me and considered in my medical decision making (see chart for details).  Rechecked pt and she is in the bathroom.   21:14 Pt in bathroom having bowel movement.  Rechecked pt. She states her CP has improved, but her shortness of breath continues.  20:00 Reviewed CTA and positive for PE.  Radiologist called to inform Dr. Hyacinth Meeker that P and right heart strain present.  Will initiate heparin therapy and consult was placed to ICU by Dr. Hyacinth Meeker.  20:14 Dr. Hyacinth Meeker discussed pt case with Dr. Dellie Catholic, critical care, who will consult on pt.   Patient admitted to Dr. Deirdre Priest in stepdown unit.  Final Clinical Impressions(s) / ED Diagnoses   Final diagnoses:  Chest pain, unspecified type  Shortness of breath  Seizure-like activity (HCC)  Acute saddle pulmonary embolism with acute cor pulmonale (HCC)  Thyroid mass   61 year old female presented to ED with suspected seizure activity prior to arrival. Acute onset of Chest pain and shortness of breath onset after episode. Workup significant for elevated white count, elevated troponin x2, elevated d-dimer.  CT angio ordered and significant for saddle PE with evidence of right heart strain.  Seizure workup revealed CT head negative for any acute abnormality. No hypoglycemia and remainder of labs  noncontributory.  No seizure activity noted while in the ED.  More likely that patient had syncopal episode precipitated by saddle PE. Patient has been stable in the ED, but has required O2 by nasal cannula.  Heparin initiated and ICU consulted.  Patient admitted to stepdown.  ED Discharge Orders    None       Rayne Du 04/03/17 0141    Eber Hong, MD 04/03/17 [phone]

## 2017-04-02 NOTE — ED Notes (Signed)
Pt states that morphine is too strong for her and she does not want any narcotics.

## 2017-04-02 NOTE — Progress Notes (Signed)
ANTICOAGULATION CONSULT NOTE - Initial Consult  Pharmacy Consult for Heparin Indication: pulmonary embolus  Allergies  Allergen Reactions  . Griseofulvin Rash    Patient Measurements: Height: 5\' 5"  (165.1 cm) Weight: 216 lb (98 kg) IBW/kg (Calculated) : 57 Heparin Dosing Weight: 79kg  Vital Signs: Temp: 97.9 F (36.6 C) (12/30 1755) Temp Source: Oral (12/30 1755) BP: 131/82 (12/30 2015) Pulse Rate: 107 (12/30 2015)  Labs: Recent Labs    04/02/17 1803  HGB 12.4  HCT 39.6  PLT 130*  CREATININE 0.68    Estimated Creatinine Clearance: 83.4 mL/min (by C-G formula based on SCr of 0.68 mg/dL).   Medical History: Past Medical History:  Diagnosis Date  . Arthritis    Assessment: Barbara Wells to begin heparin for acute bilateral saddle pulmonary embolus. Evidence of right heart strain present. No anticoagulants pta.   Goal of Therapy:  Heparin level 0.3-0.7 units/ml Monitor platelets by anticoagulation protocol: Yes   Plan:  1) Heparin bolus 5000 units x 1 2) Heparin drip at 1400 units/hr 3) Daily heparin level and CBC  Fredrik RiggerMarkle, Michaila Kenney Sue 04/02/2017,10:18 PM

## 2017-04-02 NOTE — ED Triage Notes (Signed)
Per EMS, pt from home with 2 witnessed seizures, both lasting 2 minutes with full body movements, no history. Pt incontinent of urine, no trauma to the tongue.  Pt noted to be diaphoretic, and c/o midsternal, nonradiating chest pain. Given 324 aspirin and 2NTG PTA. Chest pain 5/10 upon arrival.

## 2017-04-02 NOTE — Consult Note (Signed)
Name: Barbara Wells MRN: [id number] DOB: [birth year]    ADMISSION DATE:  04/02/2017 CONSULTATION DATE:  04/02/2017  REFERRING MD :  Dr. Hyacinth MeekerMiller   CHIEF COMPLAINT:  PE   HISTORY OF PRESENT ILLNESS:   61 year old female with PMH of Arthritis   Presents to ED on 12/30 with new onset seizures. Reported witnessed 2 episodes of tonic-clonic seizures. Upon arrival to ED patient was tachycardiac, hypoxic, and complaining of chest pain. Given nitroglycerin and ASA. CTA revealed large saddle embolus in bilateral lungs and multi-lobulated mass of left thyroid. Started on heparin. PCCM consulted.   Patient/Family deny recent travel or long periods of sitting. However, patient is fairly inactive at a baseline per family. States that patient has had pain to RLE for a couple of days with noted swelling. Denies history of heart arrhthymias.    SIGNIFICANT EVENTS  12/30 > Presents to ED   STUDIES:  CTA Chest 12/30 > Large saddle pulmonary embolus, extending bilaterally into all lobes of both lungs. CT evidence of right heart strain (RV/LV Ratio = 1.35) consistent with at least submassive (intermediate risk) PE. The presence of right heart strain has been associated with an increased risk of morbidity and mortality. Please activate Code PE by paging [phone]. 2. Large multilobulated mildly heterogeneous mass arising at the left thyroid lobe, measuring 6.8 x 5.8 x 4.2 cm. Malignancy cannot be excluded. Recommend further evaluation with thyroid ultrasound. If patient is clinically hyperthyroid, consider nuclear medicine thyroid uptake and scan. 3. Mild bilateral dependent subsegmental atelectasis. Lungs otherwise clear. 4. Scattered coronary artery calcifications.   PAST MEDICAL HISTORY :   has a past medical history of Arthritis.  has no past surgical history on file. Prior to Admission medications   Medication Sig Start Date End Date Taking? Authorizing Provider  acetaminophen  (TYLENOL) 500 MG tablet Take 1,000 mg by mouth every 6 (six) hours as needed for pain.    [provider]  albuterol (PROVENTIL HFA;VENTOLIN HFA) 108 (90 Base) MCG/ACT inhaler Inhale 2 puffs into the lungs every 6 (six) hours as needed for wheezing or shortness of breath. 05/31/16   Renne MuscaWarden, Daniel L, MD  atorvastatin (LIPITOR) 20 MG tablet Take 1 tablet (20 mg total) by mouth daily. 06/05/16   Renne MuscaWarden, Daniel L, MD  ergocalciferol (VITAMIN D2) 50000 UNITS capsule Take 1 capsule (50,000 Units total) by mouth once a week. 06/11/12   Anders SimmondsMcClung, Angela M, PA-C  fluticasone (FLONASE) 50 MCG/ACT nasal spray Place 2 sprays into the nose daily. 06/07/12   Anders SimmondsMcClung, Angela M, PA-C  meloxicam (MOBIC) 7.5 MG tablet Take 1 tablet (7.5 mg total) by mouth daily. Prn pain 06/07/12   Anders SimmondsMcClung, Angela M, PA-C  promethazine (PHENERGAN) 12.5 MG tablet Take 1 tablet (12.5 mg total) by mouth every 6 (six) hours as needed for nausea. Or dizziness 06/07/12   Anders SimmondsMcClung, Angela M, PA-C   Allergies  Allergen Reactions  . Griseofulvin Rash    FAMILY HISTORY:  family history includes ADD / ADHD in her son; Heart disease in her father; Hernia in her son; Hypertension in her mother; Kidney disease in her brother; Stroke in her mother. SOCIAL HISTORY:  reports that  has never smoked. she has never used smokeless tobacco. She reports that she does not drink alcohol or use drugs.  REVIEW OF SYSTEMS:   All negative; except for those that are bolded, which indicate positives.  Constitutional: weight loss, weight gain, night sweats, fevers, chills, fatigue, weakness.  HEENT: headaches,  sore throat, sneezing, nasal congestion, post nasal drip, difficulty swallowing, tooth/dental problems, visual complaints, visual changes, ear aches. Neuro: difficulty with speech, weakness, numbness, ataxia. CV:  chest pain, orthopnea, PND, swelling in lower extremities, dizziness, palpitations, syncope.  Resp: cough, hemoptysis, dyspnea,  wheezing. GI: heartburn, indigestion, abdominal pain, nausea, vomiting, diarrhea, constipation, change in bowel habits, loss of appetite, hematemesis, melena, hematochezia.  GU: dysuria, change in color of urine, urgency or frequency, flank pain, hematuria. MSK: pain/swelling to right LE, decreased range of motion. Psych: change in mood or affect, depression, anxiety, suicidal ideations, homicidal ideations. Skin: rash, itching, bruising.  SUBJECTIVE:   VITAL SIGNS: Temp:  [97.9 F (36.6 C)] 97.9 F (36.6 C) (12/30 1755) Pulse Rate:  [105-114] 107 (12/30 2015) Resp:  [16-27] 20 (12/30 2015) BP: (100-131)/(59-82) 131/82 (12/30 2015) SpO2:  [92 %-98 %] 97 % (12/30 2015) Weight:  [98 kg (216 lb)] 98 kg (216 lb) (12/30 2200)  PHYSICAL EXAMINATION: General:  Adult female, no distress  Neuro:  Lethargic, follows commands, answers questions  HEENT:  MMM Cardiovascular:  Tachy, no MRG  Lungs:  Clear breath sounds, no wheeze Abdomen: obese, active bowel sounds  Musculoskeletal: +2 edema BLE   Skin:  Warm, dry, intact   Recent Labs  Lab 04/02/17 1803  NA 135  K 3.4*  CL 107  CO2 17*  BUN 12  CREATININE 0.68  GLUCOSE 192*   Recent Labs  Lab 04/02/17 1803  HGB 12.4  HCT 39.6  WBC 11.8*  PLT 130*   Ct Head Wo Contrast  Result Date: 04/02/2017 CLINICAL DATA:  New nontraumatic seizures and urine incontinence. EXAM: CT HEAD WITHOUT CONTRAST TECHNIQUE: Contiguous axial images were obtained from the base of the skull through the vertex without intravenous contrast. COMPARISON:  None. FINDINGS: Brain: No evidence of acute infarction, hemorrhage, hydrocephalus, extra-axial collection or mass lesion/mass effect. Vascular: No hyperdense vessel or unexpected calcification. Skull: Normal. Negative for fracture or focal lesion. Sinuses/Orbits: No acute finding. Other: None. IMPRESSION: No acute intracranial abnormalities. Electronically Signed   By: Burman NievesWilliam  Stevens M.D.   On: 04/02/2017  18:47   Dg Chest Port 1 View  Result Date: 04/02/2017 CLINICAL DATA:  Shortness of breath with movement.  Seizure today. EXAM: PORTABLE CHEST 1 VIEW COMPARISON:  None. FINDINGS: The heart size and mediastinal contours are within normal limits. Both lungs are clear. The visualized skeletal structures are unremarkable. IMPRESSION: No active cardiopulmonary disease. Electronically Signed   By: Sherian ReinWei-Chen  Lin M.D.   On: 04/02/2017 20:29    ASSESSMENT / PLAN:  Pulmonary Embolus with RV strain in setting of malignancy given left thyroid lobe mass vs DVT in LE due to decreased mobility  -Troponin 0.18, D.Dimer 13.36,  RV/LV Ratio = 1.35  Plan  -Cardiac Monitoring  -Wean supplemental oxygen to maintain saturation > 92 (Currently on 2L [place] with no distress)  -ECHO and LE Venous US ordered -Continue Heparin gtt -TSH and Thyroid US ordered   Patient is stable for admission to step-down with family medicine. Please call back if further assistance needed.   Jovita KussmaulKatalina Eubanks, AGACNP-BC [facility]  Pgr: [phone]  PCCM Pgr: [phone]

## 2017-04-02 NOTE — H&P (Signed)
[facility] and Physical Service Pager: [phone]  Patient name: Barbara AguasSarah E Noble Medical record number: [id number] Date of birth: [birth year] Age: 61 y.o. Gender: female  Primary Care Provider: Renne MuscaWarden, Daniel L, MD Consultants: CCM Code Status: FULL (confirmed on admission)  Chief Complaint: seizures  Assessment and Plan: Barbara Wells is a 61 y.o. female presenting with seizure-like activity. Noted to have submassive saddle pulmonary embolus. PMH is significant for asthma.  Submassive saddle pulmonary embolus: Hypotensive, tachycardic, and tachypneic in ED, with reports of chest pain. D-dimer elevated at 13.36. CTA with large saddle pulmonary embolus extending bilaterally into all lobes of both lungs with evidence of R heart strain consistent with at least submassive PE. CXR WNL. No known risk factors for hypercoagulable state, however there is concern for possible thyroid malignancy given thyroid mass noted on CTA, which could increase risk of developing VTE. Mild LE swelling and pain bilaterally, though no obvious palpable cords or areas of erythema. S/p heparin bolus in ED.  - Admit to SDU, attending Dr. Deirdre Priesthambliss - CCM following, appreciate recommendations - Heparin gtt - Telemetry - Continuous pulse ox - Supplemental O2 PRN - LE Dopplers to assess for DVT - Echo  Seizure-like activity: Witnessed by two relatives at home. Description consistent with tonic-clonic seizure. Urinary incontinence noted, no tongue biting. No history of seizure disorder. No known preceding trauma. CT head with no intracranial abnormalities. A&Ox3 on exam with no neurological deficits since ED presentation. Relation to PE unclear, and could be simply coincidental.  - Seizure precautions - Continue to monitor  Thyroid mass: Noted on CTA. Large multilobulated mildly heterogeneous mass arising at L thyroid lobe measuring 6.8x5.8x4.2cm with concern for  malignancy.  - TSH - Thyroid US  Elevated troponin: I-stat trop 0.18, repeat 0.24. Likely related to R heart strain 2/2 submassive PE, as CT evidence of R heart strain noted.  - Trend trop - Cont heparin gtt - Telemetry  Glaucoma - Continue home latanoprost drops  Asthma: Currently on supplemental O2 and with tachypnea, however most likely 2/2 saddle PE than asthma exacerbation.  - Continuous pulse ox - Continue home albuterol PRN  FEN/GI: regular diet Prophylaxis: heparin gtt  Disposition: Admit to SDU  History of Present Illness:  Barbara Wells is a 61 y.o. female presenting with seizures.   Patient was at home this afternoon when she had two witnessed episodes of seizure-like activity. Her son, who is at bedside, reports that during first episode, patient was sitting in a chair when she began to generally shake. He reports that her eyes rolled back in her head, and she was unresponsive, even when a light was shone into her eyes. Afterwards she was groggy and disoriented. She had another similar episode soon after. No history of seizures. Son subsequently called EMS. Blood sugar checked by EMS was WNL, though patient was noted to be tachycardic and hypoxic and reported chest pain. She was given nitroglycerin and ASA en route to hospital and symptoms improved by time of ED presentation.   In ED, patient noted to be persistently tachycardic and hypoxic, as well as hypotensive and tachypneic. She again reported chest pain. D-dimer was obtained which was elevated, and CTA revealed large saddle embolus. Critical care was consulted who recommended beginning heparin gtt and admitted to SDU. Family medicine consulted at this point to admit and continue care.   Review Of Systems: Per HPI with the following additions:   Review of Systems  Respiratory: Positive  for shortness of breath.   Cardiovascular: Positive for chest pain.  Musculoskeletal:       Leg pain  Neurological: Positive for  seizures and loss of consciousness.    Patient Active Problem List   Diagnosis Date Noted  . Asthma 05/31/2016  . Obesity (BMI 30.0-34.9) 05/31/2016  . Glaucoma, right eye 06/07/2012    Past Medical History: Past Medical History:  Diagnosis Date  . Arthritis     Past Surgical History: History reviewed. No pertinent surgical history.  Social History: Social History   Tobacco Use  . Smoking status: Never Smoker  . Smokeless tobacco: Never Used  Substance Use Topics  . Alcohol use: No  . Drug use: No   Additional social history: Lives at home with son.   Please also refer to relevant sections of EMR.  Family History: Family History  Problem Relation Age of Onset  . Stroke Mother   . Hypertension Mother   . Heart disease Father   . Kidney disease Brother   . Hernia Son   . ADD / ADHD Son     Allergies and Medications: Allergies  Allergen Reactions  . Griseofulvin Rash   No current facility-administered medications on file prior to encounter.    Current Outpatient Medications on File Prior to Encounter  Medication Sig Dispense Refill  . acetaminophen (TYLENOL) 500 MG tablet Take 1,000 mg by mouth every 6 (six) hours as needed for headache (pain).     Marland Kitchen albuterol (PROVENTIL HFA;VENTOLIN HFA) 108 (90 Base) MCG/ACT inhaler Inhale 2 puffs into the lungs every 6 (six) hours as needed for wheezing or shortness of breath. 1 Inhaler 0  . ibuprofen (ADVIL,MOTRIN) 200 MG tablet Take 200 mg by mouth every 6 (six) hours as needed for headache (pain).    Marland Kitchen latanoprost (XALATAN) 0.005 % ophthalmic solution Place 1 drop into both eyes at bedtime.  1  . Multiple Vitamin (MULTIVITAMIN WITH MINERALS) TABS tablet Take 1 tablet by mouth at bedtime.      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 L pupil round and reactive to light; EOMI on L.  ENTM: MMM Neck: supple Cardiovascular: tachycardia, no murmurs appreciated Respiratory: CTAB, normal WOB, able to speak in full sentences Gastrointestinal: soft, NTND, +BS MSK: moving all extremities spontaneously; LE swelling bilaterally below knee without pitting edema, no palpable cords noted Derm: skin warm and dry Neuro: A&Ox4, CN II-XII grossly intact Psych: appropriate mood and affect  Labs and Imaging: CBC BMET  Recent Labs  Lab 04/02/17 1803  WBC 11.8*  HGB 12.4  HCT 39.6  PLT 130*   Recent Labs  Lab 04/02/17 1803  NA 135  K 3.4*  CL 107  CO2 17*  BUN 12  CREATININE 0.68  GLUCOSE 192*  CALCIUM 8.4*     Ct Head Wo Contrast  Result Date: 04/02/2017 CLINICAL DATA:  New nontraumatic seizures and urine incontinence. EXAM: CT HEAD WITHOUT CONTRAST TECHNIQUE: Contiguous axial images were obtained from the base of the skull through the vertex without intravenous contrast. COMPARISON:  None. FINDINGS: Brain: No evidence of acute infarction, hemorrhage, hydrocephalus, extra-axial collection or mass  lesion/mass effect. Vascular: No hyperdense vessel or unexpected calcification. Skull: Normal. Negative for fracture or focal lesion. Sinuses/Orbits: No acute finding. Other: None. IMPRESSION: No acute intracranial abnormalities. Electronically Signed   By: Burman NievesWilliam  Stevens M.D.   On: 04/02/2017 18:47   Ct Angio Chest Pe W And/or Wo Contrast  Result Date: 04/02/2017 CLINICAL DATA:  Status post 2 seizures. Urinary incontinence. Diaphoresis and midsternal chest pain, acute onset. EXAM: CT ANGIOGRAPHY CHEST WITH CONTRAST TECHNIQUE: Multidetector CT imaging of the chest was performed using the standard protocol during bolus administration of intravenous contrast. Multiplanar CT image reconstructions and MIPs were obtained to evaluate the  vascular anatomy. CONTRAST:  100mL ISOVUE-370 IOPAMIDOL (ISOVUE-370) INJECTION 76% COMPARISON:  Chest radiograph performed earlier today at 7:53 p.m. FINDINGS: Cardiovascular: A large saddle pulmonary embolus is noted, extending bilaterally into all lobes of both lungs. The RV/LV ratio is 1.35, compatible with right heart strain and at least submassive pulmonary embolus. The heart is normal in size. Scattered coronary artery calcifications are seen. The great vessels are grossly unremarkable. Mediastinum/Nodes: No mediastinal lymphadenopathy is seen. No pericardial effusion is identified. A large multilobulated mildly heterogeneous mass is seen arising at the left thyroid lobe, measuring 6.8 x 5.8 x 4.2 cm. Malignancy cannot be excluded. No axillary lymphadenopathy is seen. Lungs/Pleura: Mild bilateral dependent subsegmental atelectasis is noted. The lungs are otherwise clear. No pleural effusion or pneumothorax is seen. Upper Abdomen: The visualized portions of the liver and spleen are unremarkable. The visualized portions of the pancreas, adrenal glands and kidneys are within normal limits. Musculoskeletal: No acute osseous abnormalities are identified. The visualized musculature is unremarkable in appearance. Review of the MIP images confirms the above findings. IMPRESSION: 1. Large saddle pulmonary embolus, extending bilaterally into all lobes of both lungs. CT evidence of right heart strain (RV/LV Ratio = 1.35) consistent with at least submassive (intermediate risk) PE. The presence of right heart strain has been associated with an increased risk of morbidity and mortality. Please activate Code PE by paging [phone]. 2. Large multilobulated mildly heterogeneous mass arising at the left thyroid lobe, measuring 6.8 x 5.8 x 4.2 cm. Malignancy cannot be excluded. Recommend further evaluation with thyroid ultrasound. If patient is clinically hyperthyroid, consider nuclear medicine thyroid uptake and scan. 3.  Mild bilateral dependent subsegmental atelectasis. Lungs otherwise clear. 4. Scattered coronary artery calcifications. Critical Value/emergent results were called by telephone at the time of interpretation on 04/02/2017 at 10:08 pm to Dr. Eber HongBRIAN MILLER, who verbally acknowledged these results. Electronically Signed   By: Roanna RaiderJeffery  Chang M.D.   On: 04/02/2017 22:21   Dg Chest Port 1 View  Result Date: 04/02/2017 CLINICAL DATA:  Shortness of breath with movement.  Seizure today. EXAM: PORTABLE CHEST 1 VIEW COMPARISON:  None. FINDINGS: The heart size and mediastinal contours are within normal limits. Both lungs are clear. The visualized skeletal structures are unremarkable. IMPRESSION: No active cardiopulmonary disease. Electronically Signed   By: Sherian ReinWei-Chen  Lin M.D.   On: 04/02/2017 20:29    Marquette SaaLancaster, Katrianna Friesenhahn Joseph, MD 04/02/2017, 11:39 PM PGY-3, [facility] FPTS Intern pager: [phone], text pages welcome

## 2017-04-03 ENCOUNTER — Inpatient Hospital Stay (HOSPITAL_COMMUNITY)
Admit: 2017-04-03 | Discharge: 2017-04-03 | Disposition: A | Discharge status: Home / Self Care | Payer: PRIVATE HEALTH INSURANCE | Attending: Emergency Medicine | Admitting: Emergency Medicine

## 2017-04-03 ENCOUNTER — Inpatient Hospital Stay (HOSPITAL_COMMUNITY): Payer: PRIVATE HEALTH INSURANCE

## 2017-04-03 ENCOUNTER — Other Ambulatory Visit: Payer: Self-pay

## 2017-04-03 DIAGNOSIS — M79609 Pain in unspecified limb: Secondary | ICD-10-CM

## 2017-04-03 DIAGNOSIS — I361 Nonrheumatic tricuspid (valve) insufficiency: Secondary | ICD-10-CM

## 2017-04-03 DIAGNOSIS — E041 Nontoxic single thyroid nodule: Secondary | ICD-10-CM | POA: Insufficient documentation

## 2017-04-03 DIAGNOSIS — R569 Unspecified convulsions: Secondary | ICD-10-CM

## 2017-04-03 DIAGNOSIS — M7989 Other specified soft tissue disorders: Secondary | ICD-10-CM

## 2017-04-03 DIAGNOSIS — R0602 Shortness of breath: Secondary | ICD-10-CM | POA: Insufficient documentation

## 2017-04-03 LAB — LIPID PANEL
CHOL/HDL RATIO: 4.6 ratio
Cholesterol: 142 mg/dL (ref 0–200)
HDL: 31 mg/dL — AB (ref >40)
LDL CALC: 98 mg/dL (ref 0–99)
TRIGLYCERIDES: 66 mg/dL (ref <150)
VLDL: 13 mg/dL (ref 0–40)

## 2017-04-03 LAB — TROPONIN I
Troponin I: 0.19 ng/mL (ref <0.03)
Troponin I: 0.17 ng/mL (ref <0.03)

## 2017-04-03 LAB — HEPARIN LEVEL (UNFRACTIONATED)
HEPARIN UNFRACTIONATED: 0.59 [IU]/mL (ref 0.30–0.70)
Heparin Unfractionated: 0.75 IU/mL — ABNORMAL HIGH (ref 0.30–0.70)
Heparin Unfractionated: 0.4 IU/mL (ref 0.30–0.70)

## 2017-04-03 LAB — MRSA PCR SCREENING: MRSA by PCR: NEGATIVE

## 2017-04-03 LAB — CBC
HEMATOCRIT: 37.3 % (ref 36.0–46.0)
HEMOGLOBIN: 11.6 g/dL — AB (ref 12.0–15.0)
MCH: 24.4 pg — AB (ref 26.0–34.0)
MCHC: 31.1 g/dL (ref 30.0–36.0)
MCV: 78.5 fL (ref 78.0–100.0)
Platelets: 124 10*3/uL — ABNORMAL LOW (ref 150–400)
RBC: 4.75 MIL/uL (ref 3.87–5.11)
RDW: 15.3 % (ref 11.5–15.5)
WBC: 9.1 10*3/uL (ref 4.0–10.5)

## 2017-04-03 LAB — HEMOGLOBIN A1C
HEMOGLOBIN A1C: 6.2 % — AB (ref 4.8–5.6)
MEAN PLASMA GLUCOSE: 131.24 mg/dL

## 2017-04-03 LAB — ECHOCARDIOGRAM COMPLETE
Height: 66 in
Weight: 3452.8 oz

## 2017-04-03 LAB — TSH: TSH: 0.382 u[IU]/mL (ref 0.350–4.500)

## 2017-04-03 MED ORDER — ALBUTEROL SULFATE (2.5 MG/3ML) 0.083% IN NEBU: 2.5000 mg | INHALATION_SOLUTION | Freq: Four times a day (QID) | RESPIRATORY_TRACT | Status: DC | PRN

## 2017-04-03 NOTE — Progress Notes (Signed)
ANTICOAGULATION CONSULT NOTE - Follow Up Consult  Pharmacy Consult for heparin Indication: pulmonary embolus  Labs: Recent Labs    04/02/17 1803 04/03/17 0446  HGB 12.4 11.6*  HCT 39.6 37.3  PLT 130* 124*  HEPARINUNFRC  --  0.75*  CREATININE 0.68  --     Assessment: 61yo female slightly above goal on heparin with initial dosing for PE.   Goal of Therapy:  Heparin level 0.3-0.7 units/ml   Plan:  Will decrease heparin gtt by 1 unit/kg/hr to 1300 units/hr and check level in 6 hours.   Vernard GamblesVeronda Lelan Cush, PharmD, BCPS  04/03/2017,5:50 AM

## 2017-04-03 NOTE — Progress Notes (Signed)
ANTICOAGULATION CONSULT NOTE   Pharmacy Consult for Heparin Indication: pulmonary embolus  Allergies  Allergen Reactions  . Griseofulvin Rash    Patient Measurements: Height: 5\' 6"  (167.6 cm) Weight: 215 lb 12.8 oz (97.9 kg) IBW/kg (Calculated) : 59.3 Heparin Dosing Weight: 79kg  Vital Signs: Temp: 98.2 F (36.8 C) (12/31 2005) Temp Source: Oral (12/31 2005) BP: 112/58 (12/31 2005) Pulse Rate: 96 (12/31 2005)  Labs: Recent Labs    04/02/17 1803 04/03/17 0446 04/03/17 1043 04/03/17 1202 04/03/17 1853  HGB 12.4 11.6*  --   --   --   HCT 39.6 37.3  --   --   --   PLT 130* 124*  --   --   --   HEPARINUNFRC  --  0.75*  --  0.59 0.40  CREATININE 0.68  --   --   --   --   TROPONINI  --  0.19* 0.17*  --   --     Estimated Creatinine Clearance: 84.9 mL/min (by C-G formula based on SCr of 0.68 mg/dL).   Medical History: Past Medical History:  Diagnosis Date  . Arthritis    Assessment: 61yof on heparin for acute bilateral saddle pulmonary embolus. Evidence of right heart strain present. No anticoagulants pta.  Heparin level therapeutic at 0.4 but fell from 0.59 on same rate 1300 uts/hr.   CBC stable, no bleeding reported.   Goal of Therapy:  Heparin level 0.3-0.7 units/ml Monitor platelets by anticoagulation protocol: Yes   Plan:  Increase Heparin gtt at 1350 units/hr Daily heparin level and CBC Follow-up on oral anticoagulation plans  Leota SauersLisa Youcef Klas Pharm.D. CPP, BCPS Clinical Pharmacist [phone] 04/03/2017 8:47 PM

## 2017-04-03 NOTE — Progress Notes (Signed)
2D Echocardiogram has been performed.  Pieter PartridgeBrooke S Johnell Landowski 04/03/2017, 2:44 PM

## 2017-04-03 NOTE — Progress Notes (Signed)
ANTICOAGULATION CONSULT NOTE   Pharmacy Consult for Heparin Indication: pulmonary embolus  Allergies  Allergen Reactions  . Griseofulvin Rash    Patient Measurements: Height: 5\' 6"  (167.6 cm) Weight: 215 lb 12.8 oz (97.9 kg) IBW/kg (Calculated) : 59.3 Heparin Dosing Weight: 79kg  Vital Signs: Temp: 98.5 F (36.9 C) (12/31 1236) Temp Source: Oral (12/31 1236) BP: 115/68 (12/31 1236) Pulse Rate: 111 (12/31 0945)  Labs: Recent Labs    04/02/17 1803 04/03/17 0446 04/03/17 1043 04/03/17 1202  HGB 12.4 11.6*  --   --   HCT 39.6 37.3  --   --   PLT 130* 124*  --   --   HEPARINUNFRC  --  0.75*  --  0.59  CREATININE 0.68  --   --   --   TROPONINI  --  0.19* 0.17*  --     Estimated Creatinine Clearance: 84.9 mL/min (by C-G formula based on SCr of 0.68 mg/dL).   Medical History: Past Medical History:  Diagnosis Date  . Arthritis    Assessment: 61yof on heparin for acute bilateral saddle pulmonary embolus. Evidence of right heart strain present. No anticoagulants pta.  Heparin level therapeutic at 0.59, CBC stable, no bleeding reported.   Goal of Therapy:  Heparin level 0.3-0.7 units/ml Monitor platelets by anticoagulation protocol: Yes   Plan:  Heparin gtt at 1300 units/hr Heparin level in 6 hours to confirm Daily heparin level and CBC Follow-up on oral anticoagulation plans  Adline PotterSabrina Toyoko Silos, PharmD Pharmacy Resident Pager: [phone]

## 2017-04-03 NOTE — Discharge Summary (Signed)
Family Medicine [facility] Discharge Summary  Patient name: Barbara Wells Medical record number: [id number] Date of birth: 1956-01-02 Age: 61 y.o. Gender: female Date of Admission: 04/02/2017  Date of Discharge: 04/05/2017 Admitting Physician: Carney LivingMarshall L Chambliss, MD  Primary Care Provider: Renne MuscaWarden, Daniel L, MD Consultants: CCM  Indication for Hospitalization: PE  Discharge Diagnoses/Problem List:  Submassive saddle pulmonary embolism Hypoxia Tachycardia Seizure-like activity Left thyroid mass Prediabetes Glaucoma right eye Asthma Obesity  Disposition: home  Discharge Condition: Stable, improved  Discharge Exam:  Gen: Alert and Oriented x 3, NAD HEENT: Normocephalic, atraumatic, PERRLA, EOMI Neck: Trachea deviated due to thyroid mass CV: RRR, no murmurs, normal S1, S2 split, +2 pulses dorsalis pedis bilaterally, no JVD, no carotid bruits Resp: CTAB, no wheezing, rales, or rhonchi, comfortable work of breathing Abd: non-distended, non-tender, soft, +bs in all four quadrants MSK: FROM in all four extremities Ext: no clubbing, cyanosis, or edema Neuro: CN II-XII intact, no focal or gross deficits; 5/5 motor strength in upper and lower extremities Skin: warm, dry, intact, no rashes  Brief Hospital Course:  Barbara DubinSarah E Macauleyis a 61 y.o.femalepresenting with seizure-like activity with hypoxia found to have submassive PE.PMH is significant forprediabetes, 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  Patient and family endorsing 2 episodes of seizure-like activity with tonic-clonic presentation without postictal state.  [facility] found to have negative head CT.  Patient also presenting with tachycardia and hypoxia with new oxygen requirement and an elevated d-dimer and troponin.  Subsequent CTA found with submassive PE extending to all pulmonary lobes.  Heparin drip was initiated and her vitals  stabilized. On 04/05/2017 she had oxygen saturation of 98% on room air and could maintain this while ambulating. She denied any shortness of breath, difficulty or pain on breathing and wanted to go home. She was transitioned to oral anticoagulation and started on Xarelto. Please see specific details in the "Issues for Follow Up" section.  Of note, incidental finding concerning for left thyroid mass found on CTA.  Subsequent thyroid ultrasound consistent with goiter however warranting six-month follow-up ultrasound.  Issues for Follow Up:  1. She has been discharged on the Xarelto starter pack. Starting on 04/05/2017 she will take 15mg  BID for 21 days; on 04/25/2017 she will begin taking 20mg  daily and she will need to take this for at least 3 months. She will need additional supply of 20mg . 2. Thyroid ultrasound was concerning for goiter that is displacing her trachea and her TSH is normal. Original note recommended ultrasound repeat in 6 months however Dr. Gwendolyn GrantWalden recommends thyroid uptake scan. 3. Consider restarting atorvastatin and discussed lifestyle modifications for prediabetes. 4. Has history of severe vitamin D deficiency.  Would benefit from repeat vitamin D level.  Would also consider DEXA scan if indicated.  Significant Procedures:   None  Significant Labs and Imaging:  Recent Labs  Lab 04/02/17 1803 04/03/17 0446  WBC 11.8* 9.1  HGB 12.4 11.6*  HCT 39.6 37.3  PLT 130* 124*   Recent Labs  Lab 04/02/17 1803 04/02/17 1903  NA 135  --   K 3.4*  --   CL 107  --   CO2 17*  --   GLUCOSE 192*  --   BUN 12  --   CREATININE 0.68  --   CALCIUM 8.4*  --   MG  --  1.9  ALKPHOS 90  --   AST 19  --   ALT  14  --   ALBUMIN 3.5  --    Ethanol: <10 Magnesium: 1.9 (WNL) D-dimer: 13.36 (elevated) UDS: Negative TSH: 0.382 (WNL) A1c: 6.2 (elevated) Troponin: 0.17<0.19<0.24<0.18 (elevated)  Imaging/Diagnostic Tests: US THYROID (04/02/17): The left thyroid is diffusely enlarged and  heterogeneous. Is likely due to goiter. No focal dominant nodule identified. Due to size, consider six-month follow-up ultrasound study.  CT ANGIO CHEST PE W AND/OR WO CONTRAST (04/02/17):  1. Large saddle pulmonary embolus, extending bilaterally into all lobes of both lungs. CT evidence of right heart strain (RV/LV Ratio = 1.35) consistent with at least submassive (intermediate risk) PE. The presence of right heart strain has been associated with an increased risk of morbidity and mortality. Please activate Code PE by paging [phone]. 2. Large multilobulated mildly heterogeneous mass arising at the left thyroid lobe, measuring 6.8 x 5.8 x 4.2 cm. Malignancy cannot be excluded. Recommend further evaluation with thyroid ultrasound. If patient is clinically hyperthyroid, consider nuclear medicine thyroid uptake and scan. 3. Mild bilateral dependent subsegmental atelectasis. Lungs otherwise clear. 4. Scattered coronary artery calcifications.  DG CHEST PORT 1 VIEW (04/02/17):  No active cardiopulmonary disease.  CT HEAD WO CONTRAST (04/02/17):  No acute intracranial abnormalities.     Results/Tests Pending at Time of Discharge:   none  Discharge Medications:  Allergies as of 04/05/2017      Reactions   Griseofulvin Rash      Medication List    TAKE these medications   acetaminophen 500 MG tablet Commonly known as:  TYLENOL Take 1,000 mg by mouth every 6 (six) hours as needed for headache (pain).   albuterol 108 (90 Base) MCG/ACT inhaler Commonly known as:  PROVENTIL HFA;VENTOLIN HFA Inhale 2 puffs into the lungs every 6 (six) hours as needed for wheezing or shortness of breath.   ibuprofen 200 MG tablet Commonly known as:  ADVIL,MOTRIN Take 200 mg by mouth every 6 (six) hours as needed for headache (pain).   latanoprost 0.005 % ophthalmic solution Commonly known as:  XALATAN Place 1 drop into both eyes at bedtime.   multivitamin with minerals Tabs  tablet Take 1 tablet by mouth at bedtime.   Rivaroxaban 15 & 20 MG Tbpk Take as directed on package: Start with one 15mg  tablet by mouth twice a day with food. On Day 22, switch to one 20mg  tablet once a day with food.       Discharge Instructions: Please refer to Patient Instructions section of EMR for full details.  Patient was counseled important signs and symptoms that should prompt return to medical care, changes in medications, dietary instructions, activity restrictions, and follow up appointments.   Follow-Up Appointments: Follow-up Information    [facility]. Go on 04/10/2017.   Why:  Go to appointment at 8:30 AM. Please arrive 15 mins early and bring medications. Contact information: [address] [place] [phone]       [facility]. Go on 04/25/2017.   Why:  Please make sure your arrive 10-15 minutes early for your 9:40am appointment. Contact information: [address] [place] [phone]        [facility] Follow up.           Arlyce HarmanLockamy, Aela Bohan, DO 04/05/2017, 2:05 PM PGY-1, [facility]

## 2017-04-03 NOTE — Progress Notes (Signed)
Family Medicine Teaching Service Daily Progress Note Intern Pager: [phone]  Patient name: Barbara Wells Medical record number: [id number] Date of birth: 1955/08/10 Age: 61 y.o. Gender: female  Primary Care Provider: Renne MuscaWarden, Daniel L, MD Consultants: CCM Code Status: Full  Pt Overview and Major Events to Date:  12/30: Admit for PE following suspected syncope vs seizure event and started on heparin gtt  Assessment and Plan: Barbara Wells is a 61 y.o. female presenting with seizure-like activity with hypoxia found to have submassive PE. 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  Submassive saddle pulmonary embolus  Hypoxia  Tachycardia: Acute.  Stable.  CTA on admission suggest right heart strain with thrombus extending to all lobes.  No prior history of VTE.  Started on heparin GTT upon admission.  Continues to require 2 L nasal cannula with desaturation to 88% with speech, however patient able to maintain full sentences without pause.  No signs of impending respiratory failure at present.  Does have plateaued elevated troponins likely due to heart strain from PE. - Patient to remain in SDU, CCM aware, appreciate recommendations - Continue heparin GTT, will need transition to oral anticoagulant prior to discharge - Pending echocardiogram and lower extremity Dopplers - Continuous pulse ox and telemetry - Supplemental O2 as needed to keep O2 sats >88%  Seizure-like activity: Acute.  Patient experiencing 2 episodes of generalized tonic clonic seizure witnessed by family prior to admission.  No previous history of seizure.  Patient was not in postictal state upon admission.  Neuro exam remains unremarkable.  Differential also includes syncope due to PE. - Holding on neuro consult for further evaluation unless focal deficit or seizure like activity presents  Left thyroid mass: Finding on CTA.   Follow-up thyroid ultrasound supports goiter.  TSH WNL.  - Follow-up thyroid ultrasound in 6 months  Prediabetes: A1c 6.2 on admission.  Patient does have prior A1c in prediabetic range earlier in the year. - Recommend outpatient follow-up and lifestyle modifications - Holding Lipitor 20 mg daily due to patient endorsing non-adherence to medication, consider restarting outpatient  Glaucoma of right eye: Chronic.  Known blindness in right eye.  Stable. - Follow-up outpatient, Xalatan 0.005% ophthalmic solution 1 drop both eyes QHS  Asthma: Chronic.  Stable.  Hypoxic due to PE without wheeze or signs of asthma exacerbation. - Holding Flonase for allergies and albuterol inhaler 2 puffs every 6 hours as needed for wheeze or shortness of breath  Obesity: Chronic.  Comorbidities include prediabetes. - Recommend outpatient management occluding diet and exercise  Severe vitamin D deficiency: Chronic.  Last vitamin D in 2014. - Holding ergocalciferol 50,000 units once weekly, recommend repeat vitamin D level outpatient  FEN/GI: Regular diet PPx: Heparin gtt for PE  Disposition: Pending improvement of hypoxia and need for transition to oral anticoagulant for PE  Subjective:  Patient states she is doing well this morning.  She denies shortness of breath or chest pain at present.  Patient is continually asking when she can go home.  She denies any focal deficits and can recall her hospitalization and recent events.  Objective: Temp:  [97.9 F (36.6 C)-99.1 F (37.3 C)] 99.1 F (37.3 C) (12/31 0945) Pulse Rate:  [95-114] 111 (12/31 0945) Resp:  [0-31] 14 (12/31 0945) BP: (86-131)/(59-97) 104/71 (12/31 0945) SpO2:  [89 %-100 %] 98 % (12/31 0945) Weight:  [215 lb 12.8 oz (97.9 kg)-216 lb (98 kg)] 215 lb 12.8  oz (97.9 kg) (12/31 0945) Physical Exam: General: well nourished, well developed, NAD with non-toxic appearance HEENT: normocephalic, atraumatic, moist mucous membranes, chronic  unresponsive pupil in right eye with known blindness, EOMI Neck: supple, non-tender without lymphadenopathy, no JVD Cardiovascular: tachycardia with regular rhythm without murmurs, rubs, or gallops Lungs: clear to auscultation bilaterally with normal work of breathing speaking in full sentences with desaturation as low as 88% with speech while on 2 L nasal cannula Abdomen: soft, non-tender, non-distended, normoactive bowel sounds Skin: warm, dry, no rashes or lesions, cap refill < 2 seconds Extremities: warm and well perfused, normal tone, no edema Neuro: A&Ox4, CNII-XII grossly intact, no dysarthria  Laboratory: Recent Labs  Lab 04/02/17 1803 04/03/17 0446  WBC 11.8* 9.1  HGB 12.4 11.6*  HCT 39.6 37.3  PLT 130* 124*   Recent Labs  Lab 04/02/17 1803  NA 135  K 3.4*  CL 107  CO2 17*  BUN 12  CREATININE 0.68  CALCIUM 8.4*  PROT 6.0*  BILITOT 0.5  ALKPHOS 90  ALT 14  AST 19  GLUCOSE 192*   Lipid Panel     Component Value Date/Time   CHOL 142 04/02/2017 2339   TRIG 66 04/02/2017 2339   HDL 31 (L) 04/02/2017 2339   CHOLHDL 4.6 04/02/2017 2339   VLDL 13 04/02/2017 2339   LDLCALC 98 04/02/2017 2339   Ethanol: <10 Magnesium: 1.9 (WNL) D-dimer: 13.36 (elevated) UDS: Negative TSH: 0.382 (WNL) A1c: 6.2 (elevated) Troponin: 0.17<0.19<0.24<0.18 (elevated)  Imaging/Diagnostic Tests: US THYROID (04/02/17): The left thyroid is diffusely enlarged and heterogeneous. Is likely due to goiter. No focal dominant nodule identified. Due to size, consider six-month follow-up ultrasound study.  CT ANGIO CHEST PE W AND/OR WO CONTRAST (04/02/17):  1. Large saddle pulmonary embolus, extending bilaterally into all lobes of both lungs. CT evidence of right heart strain (RV/LV Ratio = 1.35) consistent with at least submassive (intermediate risk) PE. The presence of right heart strain has been associated with an increased risk of morbidity and mortality. Please activate Code PE by  paging [phone]. 2. Large multilobulated mildly heterogeneous mass arising at the left thyroid lobe, measuring 6.8 x 5.8 x 4.2 cm. Malignancy cannot be excluded. Recommend further evaluation with thyroid ultrasound. If patient is clinically hyperthyroid, consider nuclear medicine thyroid uptake and scan. 3. Mild bilateral dependent subsegmental atelectasis. Lungs otherwise clear. 4. Scattered coronary artery calcifications.  DG CHEST PORT 1 VIEW (04/02/17):  No active cardiopulmonary disease.  CT HEAD WO CONTRAST (04/02/17):  No acute intracranial abnormalities.  Wendee BeaversMcMullen, Trek Kimball J, DO 04/03/2017, 12:14 PM PGY-2, [place] [facility] FPTS Intern pager: [phone], text pages welcome

## 2017-04-03 NOTE — Progress Notes (Addendum)
*  Preliminary Results* Bilateral lower extremity venous duplex completed. Right lower extremity is negative for deep vein thrombosis.  The left lower extremity is positive for acute deep vein thrombosis involving the left gastrocnemius, posterior tibial, and peroneal veins. There is subacute deep vein thrombosis involving the left popliteal vein.  There is no evidence of Baker's cyst bilaterally.  Incidental finding: There is a heterogenous area of the right groin measuring 3.95cm suggestive of possible enlarged inguinal lymph node. There is a heterogenous area of the left groin with internal anechoic areas measuring 3.49cm, suggestive of a possible atypical enlarged lymph node.  Preliminary results discussed with Shawna OrleansMelanie, RN.  04/03/2017 3:04 PM Gertie FeyMichelle Blane Worthington, BS, RVT, RDCS, RDMS

## 2017-04-04 DIAGNOSIS — R0902 Hypoxemia: Secondary | ICD-10-CM | POA: Diagnosis present

## 2017-04-04 DIAGNOSIS — R32 Unspecified urinary incontinence: Secondary | ICD-10-CM | POA: Diagnosis present

## 2017-04-04 DIAGNOSIS — H409 Unspecified glaucoma: Secondary | ICD-10-CM | POA: Diagnosis present

## 2017-04-04 DIAGNOSIS — R000 Tachycardia, unspecified: Secondary | ICD-10-CM | POA: Diagnosis present

## 2017-04-04 DIAGNOSIS — E669 Obesity, unspecified: Secondary | ICD-10-CM | POA: Diagnosis present

## 2017-04-04 DIAGNOSIS — Z6834 Body mass index (BMI) 34.0-34.9, adult: Secondary | ICD-10-CM | POA: Diagnosis not present

## 2017-04-04 DIAGNOSIS — R079 Chest pain, unspecified: Secondary | ICD-10-CM

## 2017-04-04 DIAGNOSIS — G40409 Other generalized epilepsy and epileptic syndromes, not intractable, without status epilepticus: Secondary | ICD-10-CM | POA: Diagnosis present

## 2017-04-04 DIAGNOSIS — R7303 Prediabetes: Secondary | ICD-10-CM | POA: Diagnosis present

## 2017-04-04 DIAGNOSIS — E559 Vitamin D deficiency, unspecified: Secondary | ICD-10-CM | POA: Diagnosis present

## 2017-04-04 DIAGNOSIS — Z8249 Family history of ischemic heart disease and other diseases of the circulatory system: Secondary | ICD-10-CM | POA: Diagnosis not present

## 2017-04-04 DIAGNOSIS — Z79899 Other long term (current) drug therapy: Secondary | ICD-10-CM | POA: Diagnosis not present

## 2017-04-04 DIAGNOSIS — J45909 Unspecified asthma, uncomplicated: Secondary | ICD-10-CM | POA: Diagnosis present

## 2017-04-04 DIAGNOSIS — Z888 Allergy status to other drugs, medicaments and biological substances status: Secondary | ICD-10-CM | POA: Diagnosis not present

## 2017-04-04 DIAGNOSIS — M79606 Pain in leg, unspecified: Secondary | ICD-10-CM | POA: Diagnosis present

## 2017-04-04 DIAGNOSIS — Z9114 Patient's other noncompliance with medication regimen: Secondary | ICD-10-CM | POA: Diagnosis not present

## 2017-04-04 DIAGNOSIS — H5461 Unqualified visual loss, right eye, normal vision left eye: Secondary | ICD-10-CM | POA: Diagnosis present

## 2017-04-04 DIAGNOSIS — E049 Nontoxic goiter, unspecified: Secondary | ICD-10-CM | POA: Diagnosis present

## 2017-04-04 DIAGNOSIS — R52 Pain, unspecified: Secondary | ICD-10-CM

## 2017-04-04 DIAGNOSIS — R0602 Shortness of breath: Secondary | ICD-10-CM | POA: Diagnosis present

## 2017-04-04 DIAGNOSIS — Z9981 Dependence on supplemental oxygen: Secondary | ICD-10-CM | POA: Diagnosis not present

## 2017-04-04 DIAGNOSIS — I2692 Saddle embolus of pulmonary artery without acute cor pulmonale: Secondary | ICD-10-CM | POA: Diagnosis present

## 2017-04-04 LAB — CBC
HEMATOCRIT: 38.7 % (ref 36.0–46.0)
Hemoglobin: 12 g/dL (ref 12.0–15.0)
MCH: 24.3 pg — ABNORMAL LOW (ref 26.0–34.0)
MCHC: 31 g/dL (ref 30.0–36.0)
MCV: 78.5 fL (ref 78.0–100.0)
Platelets: 140 10*3/uL — ABNORMAL LOW (ref 150–400)
RBC: 4.93 MIL/uL (ref 3.87–5.11)
RDW: 15.3 % (ref 11.5–15.5)
WBC: 7 10*3/uL (ref 4.0–10.5)

## 2017-04-04 LAB — COMPREHENSIVE METABOLIC PANEL
ALT: 13 U/L — ABNORMAL LOW (ref 14–54)
ANION GAP: 10 (ref 5–15)
AST: 23 U/L (ref 15–41)
Albumin: 3.5 g/dL (ref 3.5–5.0)
Alkaline Phosphatase: 89 U/L (ref 38–126)
BILIRUBIN TOTAL: 0.8 mg/dL (ref 0.3–1.2)
BUN: 10 mg/dL (ref 6–20)
CO2: 19 mmol/L — ABNORMAL LOW (ref 22–32)
Calcium: 8.8 mg/dL — ABNORMAL LOW (ref 8.9–10.3)
Chloride: 106 mmol/L (ref 101–111)
Creatinine, Ser: 0.69 mg/dL (ref 0.44–1.00)
GFR calc Af Amer: >60 mL/min (ref >60)
Glucose, Bld: 94 mg/dL (ref 65–99)
Potassium: 4.1 mmol/L (ref 3.5–5.1)
Sodium: 135 mmol/L (ref 135–145)
TOTAL PROTEIN: 5.9 g/dL — AB (ref 6.5–8.1)

## 2017-04-04 LAB — HEPARIN LEVEL (UNFRACTIONATED): HEPARIN UNFRACTIONATED: 0.39 [IU]/mL (ref 0.30–0.70)

## 2017-04-04 MED ORDER — CALCIUM CARBONATE-VITAMIN D 500-200 MG-UNIT PO TABS
1.0000 | ORAL_TABLET | Freq: Every day | ORAL | Status: DC
  Administered 2017-04-04: 1 via ORAL
  Filled 2017-04-04 (×2): qty 1

## 2017-04-04 NOTE — Progress Notes (Signed)
Family Medicine Teaching Service Daily Progress Note Intern Pager: [phone]  Patient name: Barbara Wells Medical record number: [id number] Date of birth: 02/13/1956 Age: 62 y.o. Gender: female  Primary Care Provider: Renne Musca, MD Consultants: CCM Code Status: Full  Pt Overview and Major Events to Date:  12/30: Admit for PE following suspected syncope vs seizure event and started on heparin gtt  Assessment and Plan: Barbara Wells is a 63 y.o. female presenting with seizure-like activity with hypoxia found to have submassive PE. 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  Submassive saddle pulmonary embolism  L-leg DVT  Hypoxia  Tachycardia: Acute.  Stable.  Confirmed on CTA.  No prior history of VTE.  Tolerating heparin GTT without active bleeding or dropping hemoglobin.  Now on room air.  No signs of respiratory distress on exam.  Able to speak in full sentences.  Denies chest pain.  Tachycardia resolved. - Continues to receive SDU care, CCM aware, appreciate recommendations - Heparin GTT, will need to transition to oral anticoagulant prior to discharge - Continuous pulse ox and telemetry - Supplemental O2 as needed to keep O2 sats rater than 88%  Seizure-like activity: Acute.  Resolved.  Patient endorses 2 episodes of generalized tonic-clonic seizure witnessed by family prior to admission.  Neurological exam remains intact.  May have been syncopal episode secondary to PE. - Holding on neuro consult and further evaluation unless focal deficits or seizure-like activity presents with left  Thyroid mass: Finding on CTA.  Follow-up thyroid ultrasound supports goiter.  TSH WNL. - Follow-up thyroid ultrasound in 6 months  Prediabetes: A1c 6.2 on admission.  Patient does have prior history of prediabetic range. - Recommend outpatient follow-up and lifestyle modifications - Holding toward 20  mg daily due to patient endorsing nonadherence to medication, consider restarting outpatient  Glaucoma of right eye: Chronic.  No blindness in right eye.  Stable. - Follow-up outpatient, Xalatan 0.005% ophthalmic solution 1 drop both eyes nightly  Asthma: Chronic.  Stable.  Hypoxic due to PE without wheeze or signs of asthma exacerbation. - Holding Flonase for allergies and albuterol inhaler 2 puffs every 6 hours as needed for wheeze or shortness of breath  Obesity: Chronic.  Comorbidities include prediabetes. - Recommended outpatient management including diet and exercise   Severe vitamin D deficiency: Chronic.  Last vitamin D in 2014. - Holding ergocalciferol 50,000 units once weekly, recommend repeat vitamin D level outpatient  FEN/GI: Regular diet Prophylaxis: Heparin GTT for PE  Disposition: Pending improvement of hypoxia and need for transition to oral anticoagulant for PE  Subjective:  Patient states she is doing well.  Denies shortness of breath while on room air.  She denies chest pain, palpitations, fevers or chills, nausea or vomiting, abdominal pain.  Objective: Temp:  [98.1 F (36.7 C)-99.1 F (37.3 C)] 98.1 F (36.7 C) (01/01 0749) Pulse Rate:  [94-113] 99 (01/01 0749) Resp:  [14-26] 19 (01/01 0749) BP: (104-115)/(58-72) 114/72 (01/01 0749) SpO2:  [95 %-99 %] 97 % (01/01 0749) Weight:  [215 lb 12.8 oz (97.9 kg)-215 lb 14.4 oz (97.9 kg)] 215 lb 14.4 oz (97.9 kg) (01/01 0302) Physical Exam: General: well nourished, well developed, NAD with non-toxic appearance HEENT: normocephalic, atraumatic, moist mucous membranes, PERRLA (with exception to right eye, chronic), EOMI Neck: supple, non-tender without lymphadenopathy, no JVD Cardiovascular: regular rate and rhythm without murmurs, rubs, or gallops Lungs: clear to auscultation bilaterally with normal work of  breathing on room air Abdomen: soft, non-tender, non-distended, normoactive bowel sounds Skin: warm, dry, no  rashes or lesions, cap refill < 2 seconds Extremities: warm and well perfused, normal tone, no edema Neuro: CNII-XII remains intact, moves all 4 extremities with 5/5 motor strength  Laboratory: Recent Labs  Lab 04/02/17 1803 04/03/17 0446 04/04/17 0327  WBC 11.8* 9.1 7.0  HGB 12.4 11.6* 12.0  HCT 39.6 37.3 38.7  PLT 130* 124* 140*   Recent Labs  Lab 04/02/17 1803 04/04/17 0327  NA 135 135  K 3.4* 4.1  CL 107 106  CO2 17* 19*  BUN 12 10  CREATININE 0.68 0.69  CALCIUM 8.4* 8.8*  PROT 6.0* 5.9*  BILITOT 0.5 0.8  ALKPHOS 90 89  ALT 14 13*  AST 19 23  GLUCOSE 192* 94   Lipid Panel     Component Value Date/Time   CHOL 142 04/02/2017 2339   TRIG 66 04/02/2017 2339   HDL 31 (L) 04/02/2017 2339   CHOLHDL 4.6 04/02/2017 2339   VLDL 13 04/02/2017 2339   LDLCALC 98 04/02/2017 2339   Ethanol: <10 Magnesium: 1.9 (WNL) D-dimer: 13.36 (elevated) UDS: Negative TSH: 0.382 (WNL) A1c: 6.2 (elevated) Troponin: 0.17<0.19<0.24<0.18 (elevated)  Imaging/Diagnostic Tests: US THYROID (04/02/17): The left thyroid is diffusely enlarged and heterogeneous. Is likely due to goiter. No focal dominant nodule identified. Due to size, consider six-month follow-up ultrasound study.  CT ANGIO CHEST PE W AND/OR WO CONTRAST (04/02/17):  1. Large saddle pulmonary embolus, extending bilaterally into all lobes of both lungs. CT evidence of right heart strain (RV/LV Ratio = 1.35) consistent with at least submassive (intermediate risk) PE. The presence of right heart strain has been associated with an increased risk of morbidity and mortality. Please activate Code PE by paging [phone]. 2. Large multilobulated mildly heterogeneous mass arising at the left thyroid lobe, measuring 6.8 x 5.8 x 4.2 cm. Malignancy cannot be excluded. Recommend further evaluation with thyroid ultrasound. If patient is clinically hyperthyroid, consider nuclear medicine thyroid uptake and scan. 3. Mild bilateral  dependent subsegmental atelectasis. Lungs otherwise clear. 4. Scattered coronary artery calcifications.  DG CHEST PORT 1 VIEW (04/02/17):  No active cardiopulmonary disease.  CT HEAD WO CONTRAST (04/02/17):  No acute intracranial abnormalities.    Wendee BeaversMcMullen, David J, DO 04/04/2017, 9:00 AM PGY-2, [place] [facility] FPTS Intern pager: [phone], text pages welcome

## 2017-04-04 NOTE — Plan of Care (Signed)
Pt denies anxiety. Displays no signs or symptoms of distress 

## 2017-04-04 NOTE — Progress Notes (Signed)
ANTICOAGULATION CONSULT NOTE   Pharmacy Consult for Heparin Indication: pulmonary embolus  Allergies  Allergen Reactions  . Griseofulvin Rash    Patient Measurements: Height: 5\' 6"  (167.6 cm) Weight: 215 lb 14.4 oz (97.9 kg) IBW/kg (Calculated) : 59.3 Heparin Dosing Weight: 79kg  Vital Signs: Temp: 98.1 F (36.7 C) (01/01 0749) Temp Source: Oral (01/01 0749) BP: 114/72 (01/01 0749) Pulse Rate: 99 (01/01 0749)  Labs: Recent Labs    04/02/17 1803  04/03/17 0446 04/03/17 1043 04/03/17 1202 04/03/17 1853 04/04/17 0327  HGB 12.4  --  11.6*  --   --   --  12.0  HCT 39.6  --  37.3  --   --   --  38.7  PLT 130*  --  124*  --   --   --  140*  HEPARINUNFRC  --    < > 0.75*  --  0.59 0.40 0.39  CREATININE 0.68  --   --   --   --   --  0.69  TROPONINI  --   --  0.19* 0.17*  --   --   --    < > = values in this interval not displayed.    Estimated Creatinine Clearance: 84.9 mL/min (by C-G formula based on SCr of 0.69 mg/dL).   Medical History: Past Medical History:  Diagnosis Date  . Arthritis    Assessment: 61yof on heparin for acute bilateral saddle pulmonary embolus. Evidence of right heart strain present. No anticoagulants pta.  Heparin level remains therapeutic at 0.39 s/p rate increase to 1350 units/hr.  H/H wnl, plts low but trending up, and no bleeding reported at this time.  Goal of Therapy:  Heparin level 0.3-0.7 units/ml Monitor platelets by anticoagulation protocol: Yes   Plan:  Continue heparin gtt at 1350 units/hr Monitor heparin level, CBC, s/s bleeding F/u plans for PO switch prior discharge  Daylene PoseyJonathan Mikesha Migliaccio, PharmD Pharmacy Resident Pager #: [phone] 04/04/2017 10:36 AM

## 2017-04-04 NOTE — Plan of Care (Signed)
  Progressing Clinical Measurements: Ability to maintain clinical measurements within normal limits will improve 04/04/2017 2211 - Progressing by Horris Latinouvall, Nial Hawe G, RN Note VSS.  No acute distress noted. Respiratory complications will improve 04/04/2017 2211 - Progressing by Horris Latinouvall, Analynn Daum G, RN Note Denies SOB or difficulty breathing. Activity: Risk for activity intolerance will decrease 04/04/2017 2211 - Progressing by Horris Latinouvall, Dezi Brauner G, RN Note Transfers to/from bedside commode without difficulty.

## 2017-04-05 LAB — CBC
HEMATOCRIT: 37.2 % (ref 36.0–46.0)
HEMOGLOBIN: 11.7 g/dL — AB (ref 12.0–15.0)
MCH: 24.7 pg — ABNORMAL LOW (ref 26.0–34.0)
MCHC: 31.5 g/dL (ref 30.0–36.0)
MCV: 78.5 fL (ref 78.0–100.0)
PLATELETS: 147 10*3/uL — AB (ref 150–400)
RBC: 4.74 MIL/uL (ref 3.87–5.11)
RDW: 15.1 % (ref 11.5–15.5)
WBC: 5.2 10*3/uL (ref 4.0–10.5)

## 2017-04-05 LAB — HEPARIN LEVEL (UNFRACTIONATED): HEPARIN UNFRACTIONATED: 0.48 [IU]/mL (ref 0.30–0.70)

## 2017-04-05 MED ORDER — RIVAROXABAN 15 MG PO TABS
15.0000 mg | ORAL_TABLET | Freq: Once | ORAL | Status: DC
  Filled 2017-04-05: qty 1

## 2017-04-05 MED ORDER — RIVAROXABAN 15 MG PO TABS
15.0000 mg | ORAL_TABLET | Freq: Two times a day (BID) | ORAL | Status: DC
  Administered 2017-04-05: 15 mg via ORAL

## 2017-04-05 MED ORDER — RIVAROXABAN (XARELTO) VTE STARTER PACK (15 & 20 MG): ORAL_TABLET | ORAL | 0 refills | Status: DC

## 2017-04-05 MED ORDER — RIVAROXABAN 15 MG PO TABS: 15.0000 mg | ORAL_TABLET | Freq: Two times a day (BID) | ORAL | Status: DC

## 2017-04-05 NOTE — Plan of Care (Signed)
  Progressing Education: Knowledge of General Education information will improve 04/05/2017 1028 - Progressing by Quentin CornwallMadison, Meilah Delrosario, RN Health Behavior/Discharge Planning: Ability to manage health-related needs will improve 04/05/2017 1028 - Progressing by Quentin CornwallMadison, Mitsuru Dault, RN Clinical Measurements: Ability to maintain clinical measurements within normal limits will improve 04/05/2017 1028 - Progressing by Quentin CornwallMadison, Ahsan Esterline, RN Will remain free from infection 04/05/2017 1028 - Progressing by Quentin CornwallMadison, Donnell Wion, RN Diagnostic test results will improve 04/05/2017 1028 - Progressing by Quentin CornwallMadison, Jackie Littlejohn, RN Respiratory complications will improve 04/05/2017 1028 - Progressing by Quentin CornwallMadison, Kazuo Durnil, RN Cardiovascular complication will be avoided 04/05/2017 1028 - Progressing by Quentin CornwallMadison, Jonanthony Nahar, RN Activity: Risk for activity intolerance will decrease 04/05/2017 1028 - Progressing by Quentin CornwallMadison, Ramari Bray, RN Nutrition: Adequate nutrition will be maintained 04/05/2017 1028 - Progressing by Quentin CornwallMadison, Suzanna Zahn, RN Elimination: Will not experience complications related to bowel motility 04/05/2017 1028 - Progressing by Quentin CornwallMadison, Thelda Gagan, RN Will not experience complications related to urinary retention 04/05/2017 1028 - Progressing by Quentin CornwallMadison, Blakeley Scheier, RN Pain Managment: General experience of comfort will improve 04/05/2017 1028 - Progressing by Quentin CornwallMadison, Sallie Maker, RN Safety: Ability to remain free from injury will improve 04/05/2017 1028 - Progressing by Quentin CornwallMadison, Fischer Halley, RN Skin Integrity: Risk for impaired skin integrity will decrease 04/05/2017 1028 - Progressing by Quentin CornwallMadison, Camielle Sizer, RN

## 2017-04-05 NOTE — Progress Notes (Signed)
Pt requesting to take a shower.  Educated on importance of wearing heart monitor and need for Dr's order to remove it for shower.  Pt agrees to wait until the MD rounds to ask about a shower order.  Pt also inquiring about discharge home.  States, "I'm ready to go home, and I don't want anymore of this heparin."  Pt educated on the importance of continuing heparin until the MD transitions her to an oral anticoagulant for her PE and DVTs.  Heparin bag is empty, but pt agrees to let this RN hang a new bag of heparin.  Pt also concerned about being out of work and paying her hospital bill.  Pt reassured and encouraged to focus on getting better.  Pt's son at bedside and is supportive.  Care management consult ordered to help address pt's concerns.  Will continue to monitor.  Alonza Bogusuvall, Barbara Wells

## 2017-04-05 NOTE — Discharge Instructions (Signed)
You were hospitalized due to a submassive saddle pulmonary embolism. It is EXTREMELY important to know exactly how to take your prescribed medication (Xarelto) and to take every day exactly as prescribed. If you have any questions or should you lose your medication you should call your doctor immediately.  It is important to have close follow up with Dr. Elwanda Brooklyn your primary care physician as well as being seen by a Cardiologist. We have already arranged for you to have follow up and your appointment time and date are set for both appointments.  Please immediately return to the ED should you experience abnormal shortness of breath, difficulty breathing, pain on taking breaths, or extreme fatigue.  Pulmonary Embolism A pulmonary embolism (PE) is a sudden blockage or decrease of blood flow in one lung or both lungs. Most blockages come from a blood clot that forms in a lower leg, thigh, or arm vein (deep vein thrombosis, DVT) and travels to the lungs. A clot is blood that has thickened into a gel or solid. PE is a dangerous and life-threatening condition that needs to be treated right away. What are the causes? This condition is usually caused by a blood clot that forms in a vein and moves to the lungs. In rare cases, it may be caused by air, fat, part of a tumor, or other tissue that moves through the veins and into the lungs. What increases the risk? The following factors may make you more likely to develop this condition:  Having DVT or a history of DVT.  Being older than age 17.  Personal or family history of blood clots or blood clotting disease.  Major or lengthy surgery.  Orthopedic surgery, especially hip or knee replacement.  Traumatic injury, such as breaking a hip or leg.  Spinal cord injury.  Stroke.  Taking medicines that contain estrogen. These include birth control pills and hormone replacement therapy.  Long-term (chronic) lung or heart disease.  Cancer and  chemotherapy.  Having a central venous catheter.  Pregnancy and the period after delivery.  What are the signs or symptoms? Symptoms of this condition usually start suddenly and include:  Shortness of breath while active or at rest.  Coughing or coughing up blood or blood-tinged mucus.  Chest pain that is often worse with deep breaths.  Rapid or irregular heartbeat.  Feeling light-headed or dizzy.  Fainting.  Feeling anxious.  Sweating.  Pain and swelling in a leg. This is a symptom of DVT, which can lead to PE.  How is this diagnosed? This condition may be diagnosed based on:  Your medical history.  A physical exam.  Blood tests to check blood oxygen level and how well your blood clots, and a D-dimer blood test, which checks your blood for a substance that is released when a blood clot breaks apart.  CT pulmonary angiogram. This test checks blood flow in and around your lungs.  Ventilation-perfusion scan, also called a lung VQ scan. This test measures air flow and blood flow to the lungs.  Ultrasound of the legs to look for blood clots.  How is this treated? Treatment for this conditions depends on many factors, such as the cause of your PE, your risk for bleeding or developing more clots, and other medical conditions you have. Treatment aims to remove, dissolve, or stop blood clots from forming or growing larger. Treatment may include:  Blood thinning medicines (anticoagulants) to stop clots from forming or growing. These medicines may be given as a  pill, as an injection, or through an IV tube (infusion).  Medicines that dissolve clots (thrombolytics).  A procedure in which a flexible tube is used to remove a blood clot (embolectomy) or deliver medicine to destroy it (catheter-directed thrombolysis).  A procedure in which a filter is inserted into a large vein that carries blood to the heart (inferior vena cava). This filter (vena cava filter) catches blood clots  before they reach the lungs.  Surgery to remove the clot (surgical embolectomy). This is rare.  You may need a combination of immediate, long-term (up to 3 months after diagnosis), and extended (more than 3 months after diagnosis) treatments. Your treatment may continue for several months (maintenance therapy). You and your health care provider will work together to choose the treatment program that is best for you. Follow these instructions at home: If you are taking an anticoagulant medicine:  Take the medicine every day at the same time each day.  Understand what foods and drugs interact with your medicine.  Understand the side effects of this medicine, including excessive bruising or bleeding. Ask your health care provider or pharmacist about other side effects. General instructions  Take over-the-counter and prescription medicines only as told by your health care provider.  Anticoagulant medicines may cause side effects, including easy bruising and difficulty stopping bleeding. If you are prescribed an anticoagulant: ? Hold pressure over cuts for longer than usual. ? Tell your dentist and other health care providers that you are taking anticoagulants before you have any procedure that may cause bleeding. ? Avoid contact sports. ? Be extra careful when handling sharp objects. ? Use a soft toothbrush. Floss with waxed dental floss. ? Shave with an Neurosurgeon.  Wear a medical alert bracelet or carry a medical alert card that says you have had a PE.  Ask your health care provider when you may return to your normal activities.  Talk with your health care provider about any travel plans. It is important to make sure that you are still able to take your medicine while on trips.  Keep all follow-up visits as told by your health care provider. This is important. How is this prevented? Take these actions to lower your risk of developing another PE:  Exercise regularly. Take frequent  walks. For at least 30 minutes every day, engage in: ? Activity that involves moving your arms and legs. ? Activity that encourages good blood flow through your body by increasing your heart rate.  While traveling, drink plenty of water and avoid drinking alcohol. Ask your health care provider if you should wear below-the-knee compression stockings.  Avoid sitting or lying in bed for long periods of time without moving your legs. Exercise your arms and legs every hour during long-distance travel (over 4 hours).  If you are hospitalized or have surgery, ask your health care provider about your risks and what treatments can help prevent blood clots.  Maintain a healthy weight. Ask your health care provider what weight is healthy for you.  If you are a woman who is over age 41, avoid unnecessary use of medicines that contain estrogen, including birth control pills.  Do not use any products that contain nicotine or tobacco, such as cigarettes and e-cigarettes. This is especially important if you take estrogen medicines. If you need help quitting, ask your health care provider.  See your health care provider for regular checkups. This may include blood tests and ultrasound testing on your legs to check for new blood  clots.  Contact a health care provider if:  You missed a dose of your blood thinner medicine. Get help right away if:  You have new or increased pain, swelling, warmth, or redness in an arm or leg.  You have numbness or tingling in an arm or leg.  You have shortness of breath while active or at rest.  You have chest pain.  You have a rapid or irregular heartbeat.  You feel light-headed or dizzy.  You cough up blood.  You have blood in your vomit, stool, or urine.  You have a fever.  You have abdomen (abdominal) pain.  You have a severe fall or head injury.  You have a severe headache.  You have vision changes.  You cannot move your arms or legs.  You are  confused or have memory loss.  You are bleeding for 10 minutes or more, even with strong pressure on the wound. These symptoms may represent a serious problem that is an emergency. Do not wait to see if the symptoms will go away. Get medical help right away. Call your local emergency services (911 in the [place] Do not drive yourself to the hospital. Summary  A pulmonary embolism (PE) is a sudden blockage or decrease of blood flow in one lung or both lungs. PE is a dangerous and life-threatening condition that needs to be treated right away.  Having deep vein thrombosis (DVT) or a history of DVT is the most common risk factor for PE.  Treatments for this condition usually include medicines to thin your blood (anticoagulants) or medicines to break apart blood clots (thrombolytics).  If you are prescribed blood thinners, it is important to take the medicine every single day at the same time each day.  If you have signs of PE or DVT, call your local emergency services (911 in the [place] This information is not intended to replace advice given to you by your health care provider. Make sure you discuss any questions you have with your health care provider. Document Released: 03/18/2000 Document Revised: 04/23/2016 Document Reviewed: 04/23/2016 Elsevier Interactive Patient Education  2018 [employer]   Information on my medicine - XARELTO (rivaroxaban)  This medication education was reviewed with me or my healthcare representative as part of my discharge preparation.  The pharmacist that spoke with me during my hospital stay was:  Deziya Amero C Marielouise Amey, RPH  WHY WAS XARELTO PRESCRIBED FOR YOU? Xarelto was prescribed to treat blood clots that may have been found in the veins of your legs (deep vein thrombosis) or in your lungs (pulmonary embolism) and to reduce the risk of them occurring again.  What do you need to know about Xarelto? The starting dose is one 15 mg tablet taken TWICE daily with  food for the FIRST 21 DAYS then on (enter date)  ***  the dose is changed to one 20 mg tablet taken ONCE A DAY with your evening meal.  DO NOT stop taking Xarelto without talking to the health care provider who prescribed the medication.  Refill your prescription for 20 mg tablets before you run out.  After discharge, you should have regular check-up appointments with your healthcare provider that is prescribing your Xarelto.  In the future your dose may need to be changed if your kidney function changes by a significant amount.  What do you do if you miss a dose? If you are taking Xarelto TWICE DAILY and you miss a dose, take it as soon as you remember. You  may take two 15 mg tablets (total 30 mg) at the same time then resume your regularly scheduled 15 mg twice daily the next day.  If you are taking Xarelto ONCE DAILY and you miss a dose, take it as soon as you remember on the same day then continue your regularly scheduled once daily regimen the next day. Do not take two doses of Xarelto at the same time.   Important Safety Information Xarelto is a blood thinner medicine that can cause bleeding. You should call your healthcare provider right away if you experience any of the following: ? Bleeding from an injury or your nose that does not stop. ? Unusual colored urine (red or dark brown) or unusual colored stools (red or black). ? Unusual bruising for unknown reasons. ? A serious fall or if you hit your head (even if there is no bleeding).  Some medicines may interact with Xarelto and might increase your risk of bleeding while on Xarelto. To help avoid this, consult your healthcare provider or pharmacist prior to using any new prescription or non-prescription medications, including herbals, vitamins, non-steroidal anti-inflammatory drugs (NSAIDs) and supplements.  This website has more information on Xarelto: VisitDestination.com.br.

## 2017-04-05 NOTE — Progress Notes (Signed)
Walked 500+ feet with patient in hallways, denied SOB.  Patient Saturations on Room Air at Rest = 100%  Patient Saturations on [employer] Air while Ambulating = 99-100%

## 2017-04-05 NOTE — Progress Notes (Signed)
Pt discharged home with son. AVS and script given to patient and reviewed in full. Pt verbalizes understanding and importance of taking Xarelto as well as follow up with Cardiology. All belongings sent with patient. VSS. BP 117/65 (BP Location: Left Arm)   Pulse 99   Temp 98.7 F (37.1 C) (Oral)   Resp 19   Ht 5\' 6"  (1.676 m)   Wt 97.8 kg (215 lb 11.2 oz)   SpO2 97%   BMI 34.81 kg/m

## 2017-04-05 NOTE — Progress Notes (Signed)
ANTICOAGULATION CONSULT NOTE   Pharmacy Consult for Heparin Indication: pulmonary embolus  Allergies  Allergen Reactions  . Griseofulvin Rash    Patient Measurements: Height: 5\' 6"  (167.6 cm) Weight: 215 lb 11.2 oz (97.8 kg) IBW/kg (Calculated) : 59.3 Heparin Dosing Weight: 79kg  Vital Signs: Temp: 99.1 F (37.3 C) (01/02 0737) Temp Source: Oral (01/02 0737) BP: 116/73 (01/02 0737) Pulse Rate: 89 (01/02 0737)  Labs: Recent Labs    04/02/17 1803 04/03/17 0446 04/03/17 1043  04/03/17 1853 04/04/17 0327 04/05/17 0518  HGB 12.4 11.6*  --   --   --  12.0 11.7*  HCT 39.6 37.3  --   --   --  38.7 37.2  PLT 130* 124*  --   --   --  140* 147*  HEPARINUNFRC  --  0.75*  --    < > 0.40 0.39 0.48  CREATININE 0.68  --   --   --   --  0.69  --   TROPONINI  --  0.19* 0.17*  --   --   --   --    < > = values in this interval not displayed.    Estimated Creatinine Clearance: 84.9 mL/min (by C-G formula based on SCr of 0.69 mg/dL).   Medical History: Past Medical History:  Diagnosis Date  . Arthritis    Assessment: 61yof on heparin for acute bilateral saddle pulmonary embolus. Evidence of right heart strain present. No anticoagulants pta.  Heparin level remains therapeutic at 0.48 on 1350 units/hr.  H/H wnl, plts low but stable, and no bleeding reported at this time.  Goal of Therapy:  Heparin level 0.3-0.7 units/ml Monitor platelets by anticoagulation protocol: Yes   Plan:  Continue heparin gtt at 1350 units/hr Monitor heparin level, CBC, s/s bleeding F/u plans for oral anticoagualtion prior to discharge   Thank you for allowing us to participate in this patients care.  Signe Coltonya C Valena Ivanov, PharmD Clinical phone for 04/05/2017 from 7a-3:30p: [phone] If after 3:30p, please call main pharmacy at: [phone] 04/05/2017 11:06 AM

## 2017-04-05 NOTE — Progress Notes (Signed)
Family Medicine Teaching Service Daily Progress Note Intern Pager: [phone]  Patient name: Barbara Wells Medical record number: [id number] Date of birth: 22-Mar-1956 Age: 62 y.o. Gender: female  Primary Care Provider: Renne Musca, MD Consultants: CCM Code Status: Full  Pt Overview and Major Events to Date:  12/30: Admit for PE following suspected syncope vs seizure event and started on heparin gtt  Assessment and Plan: Barbara Wells is a 62 y.o. female presenting with seizure-like activity with hypoxia found to have submassive PE. 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  Submassive saddle pulmonary embolism  L-leg DVT  Hypoxia  Tachycardia: Acute. Stable. Confirmed on CTA.  No prior history of VTE.  Tolerating heparin GTT without active bleeding or dropping hemoglobin.  Now on room air.  No signs of respiratory distress on exam.  Able to speak in full sentences.  Denies chest pain.  Tachycardia resolved.  - Continues to receive SDU care, CCM aware, appreciate recommendations - D/c heparin trip and start on Xarelto 15mg  BID for 21 days; then 20mg  daily for at least 3 months - Continuous pulse ox and telemetry - Supplemental O2 as needed to keep O2 sats rater than 88%  Seizure-like activity: Acute.  Resolved. Patient expressed to me this morning she sat down on the couch and had trouble waking up. Neurological exam this morning was unremarkable. Most likely syncopal episode secondary to PE. - No need for neuro consult and further evaluation unless focal deficits or seizure-like activity presents with left  Thyroid mass: Finding on CTA.  Follow-up thyroid ultrasound supports cold nodule as TSH is WNL. - Follow-up thyroid ultrasound outpatient but should be done as soon as possible. Concern for possible thyroid carcinoma.  Prediabetes: A1c 6.2 on admission.  Patient does have prior history  of prediabetic range. - Recommend outpatient follow-up and lifestyle modifications - Holding toward 20 mg daily due to patient endorsing nonadherence to medication, consider restarting outpatient  Glaucoma of right eye: Chronic.  No blindness in right eye.  Stable. - Follow-up outpatient, Xalatan 0.005% ophthalmic solution 1 drop both eyes nightly  Asthma: Chronic.  Stable.  Hypoxic due to PE without wheeze or signs of asthma exacerbation. - Holding Flonase for allergies and albuterol inhaler 2 puffs every 6 hours as needed for wheeze or shortness of breath  Obesity: Chronic.  Comorbidities include prediabetes. - Recommended outpatient management including diet and exercise   Severe vitamin D deficiency: Chronic.  Last vitamin D in 2014. - Holding ergocalciferol 50,000 units once weekly, recommend repeat vitamin D level outpatient  FEN/GI: Regular diet Prophylaxis: Heparin GTT for PE  Disposition: Pending improvement of hypoxia and need for transition to oral anticoagulant for PE  Subjective:  Patient states she is doing well and wants to go home. She has no complaints other than being in the hospital. She denies any more syncopal events or seizure like events and now she states she does not believe she ever had a seizure. She denies chest pain, SOB, difficulty breathing, pain on inspiration, abdominal pain, difficulty urinating, or difficulty ambulating. Objective: Temp:  [98 F (36.7 C)-98.9 F (37.2 C)] 98.8 F (37.1 C) (01/02 0349) Pulse Rate:  [86-99] 91 (01/02 0349) Resp:  [18-28] 28 (01/02 0349) BP: (109-122)/(57-73) 122/73 (01/02 0349) SpO2:  [96 %-98 %] 98 % (01/02 0349) Weight:  [215 lb 11.2 oz (97.8 kg)] 215 lb 11.2 oz (97.8 kg) (01/02 0349) Physical Exam: Gen:  Alert and Oriented x 3, NAD HEENT: Normocephalic, atraumatic, PERRLA, EOMI Neck: Trachea deviated due to thyroid mass CV: RRR, no murmurs, normal S1, S2 split, +2 pulses dorsalis pedis bilaterally, no JVD, no  carotid bruits Resp: CTAB, no wheezing, rales, or rhonchi, comfortable work of breathing Abd: non-distended, non-tender, soft, +bs in all four quadrants MSK: FROM in all four extremities Ext: no clubbing, cyanosis, or edema Neuro: CN II-XII intact, no focal or gross deficits; 5/5 motor strength in upper and lower extremities Skin: warm, dry, intact, no rashes  Laboratory: Recent Labs  Lab 04/03/17 0446 04/04/17 0327 04/05/17 0518  WBC 9.1 7.0 5.2  HGB 11.6* 12.0 11.7*  HCT 37.3 38.7 37.2  PLT 124* 140* 147*   Recent Labs  Lab 04/02/17 1803 04/04/17 0327  NA 135 135  K 3.4* 4.1  CL 107 106  CO2 17* 19*  BUN 12 10  CREATININE 0.68 0.69  CALCIUM 8.4* 8.8*  PROT 6.0* 5.9*  BILITOT 0.5 0.8  ALKPHOS 90 89  ALT 14 13*  AST 19 23  GLUCOSE 192* 94   Lipid Panel     Component Value Date/Time   CHOL 142 04/02/2017 2339   TRIG 66 04/02/2017 2339   HDL 31 (L) 04/02/2017 2339   CHOLHDL 4.6 04/02/2017 2339   VLDL 13 04/02/2017 2339   LDLCALC 98 04/02/2017 2339   Ethanol: <10 Magnesium: 1.9 (WNL) D-dimer: 13.36 (elevated) UDS: Negative TSH: 0.382 (WNL) A1c: 6.2 (elevated) Troponin: 0.17<0.19<0.24<0.18 (elevated)  Imaging/Diagnostic Tests: US THYROID (04/02/17): The left thyroid is diffusely enlarged and heterogeneous. Is likely due to goiter. No focal dominant nodule identified. Due to size, consider six-month follow-up ultrasound study.  CT ANGIO CHEST PE W AND/OR WO CONTRAST (04/02/17):  1. Large saddle pulmonary embolus, extending bilaterally into all lobes of both lungs. CT evidence of right heart strain (RV/LV Ratio = 1.35) consistent with at least submassive (intermediate risk) PE. The presence of right heart strain has been associated with an increased risk of morbidity and mortality. Please activate Code PE by paging [phone]. 2. Large multilobulated mildly heterogeneous mass arising at the left thyroid lobe, measuring 6.8 x 5.8 x 4.2 cm. Malignancy  cannot be excluded. Recommend further evaluation with thyroid ultrasound. If patient is clinically hyperthyroid, consider nuclear medicine thyroid uptake and scan. 3. Mild bilateral dependent subsegmental atelectasis. Lungs otherwise clear. 4. Scattered coronary artery calcifications.  DG CHEST PORT 1 VIEW (04/02/17):  No active cardiopulmonary disease.  CT HEAD WO CONTRAST (04/02/17):  No acute intracranial abnormalities.    Arlyce HarmanLockamy, Camelle Henkels, DO 04/05/2017, 7:22 AM PGY-1, [facility] FPTS Intern pager: [phone], text pages welcome

## 2017-04-10 ENCOUNTER — Other Ambulatory Visit: Payer: Self-pay

## 2017-04-10 ENCOUNTER — Encounter: Payer: Self-pay | Admitting: Family Medicine

## 2017-04-10 ENCOUNTER — Inpatient Hospital Stay: Payer: Self-pay | Admitting: Family Medicine

## 2017-04-10 ENCOUNTER — Ambulatory Visit: Payer: BLUE CROSS/BLUE SHIELD | Admitting: Family Medicine

## 2017-04-10 VITALS — BP 102/56 | HR 86 | Temp 97.9°F | Ht 65.0 in | Wt 214.4 lb

## 2017-04-10 DIAGNOSIS — E049 Nontoxic goiter, unspecified: Secondary | ICD-10-CM | POA: Diagnosis not present

## 2017-04-10 DIAGNOSIS — I2692 Saddle embolus of pulmonary artery without acute cor pulmonale: Secondary | ICD-10-CM

## 2017-04-10 DIAGNOSIS — E041 Nontoxic single thyroid nodule: Secondary | ICD-10-CM

## 2017-04-10 MED ORDER — RIVAROXABAN 20 MG PO TABS: 20.0000 mg | ORAL_TABLET | Freq: Every day | ORAL | 3 refills | Status: DC

## 2017-04-10 NOTE — Progress Notes (Signed)
Subjective:  Barbara Wells is a 62 y.o. female who presents to the [facility] today for hospital follow-up  HPI:  Hospital follow-up Patient was admitted to the hospital after being found hypotensive, tachycardic and tachypneic in the ED with reports of chest pain.  She had elevated d-dimer and CTA chest showing large saddle pulmonary embolus extending bilaterally into all lobes of both lungs with evidence of right heart strain consistent with at least submassive PE.  She was discharged with Xarelto 15 mg twice daily with instructions to take this for 21 days and transition to 20 mg daily at day 22 and continue this for at least 3 months.  She was provided with a 15 and 20 mg pack and has not missed any doses.  She denies any shortness of breath, chest pain, lower extremity pain swelling or erythema.  She has not had any cough or fevers.  Additionally patient was noted to have enlarged thyroid on the left lobe had ultrasound showing diffuse enlargement with no apparent nodules.  While hospitalized she had borderline low TSH and has no overt signs of hyperthyroidism.  There was a recommendation to order a thyroid uptake scan at follow-up.   PMH: Saddle pulmonary embolus with right heart strain, left thyroid nodule Tobacco use: Never smoked Medication: reviewed and updated ROS: see HPI   Objective:  Physical Exam: BP (!) 102/56   Pulse 86   Temp 97.9 F (36.6 C) (Oral)   Ht 5\' 5"  (1.651 m)   Wt 214 lb 6.4 oz (97.3 kg)   SpO2 98%   BMI 35.68 kg/m   Gen: 62 year old female in NAD, resting comfortably HEENT: left thyroid nodule with tracheal deviation CV: RRR with no murmurs appreciated Pulm: NWOB, CTAB with no crackles, wheezes, or rhonchi GI: Normal bowel sounds present. Soft, Nontender, Nondistended. MSK: no edema, cyanosis, or clubbing noted Skin: warm, dry Neuro: grossly normal, moves all extremities Psych: Normal affect and thought content  No results found for this or any  previous visit (from the past 72 hour(s)).   Assessment/Plan:  Saddle pulmonary embolus (HCC) Patient denies any chest pain, shortness of breath, lower extremity edema, pain or swelling.  She has stable vital signs.  Diagnosed with saddle pulmonary embolus with right heart strain requiring hospitalization and is currently on Xarelto 15 mg twice daily.  Will transition to 20 mg daily on 04/25/2017 and has follow-up with cardiology that same day.  Patient has been compliant with her medication and is asymptomatic.  Sent in prescription for Xarelto 20 mg daily 3 months worth.  Asked patient to follow-up in 2 months to check in.  Discussed return precautions.   Left thyroid nodule Thyroid mass noted on chest CT. Patient has borderline low TSH with no overt symptoms.  Thyroid ultrasound showed no nodules but diffuse heterogeneous enlargement of the left side.  Radiologist recommended follow-up ultrasound in 6 months.  Will order thyroid uptake scan for concern of malignancy.  Placed order and attempted to schedule. Patient is uncomfortable with oral contrast and stated that she will get this done if she can do IV contrast.  Will continue to discuss. Patient understands return precautions.    Healthcare maintenance Patient is due for Tdap and flu shot today and reports that she would like to wait until it is warm out.  Additionally is due for Pap smear and mammogram and colonoscopy and states that she will get mammogram and Pap smear after talking to her gynecologist.  Reports  that she would prefer to do fecal cards.  Will discuss these cards in 2 months at follow-up visit.  Verlyn Lambert L. Myrtie SomanWarden, MD [facility] Resident PGY-2 04/10/2017 2:13 PM

## 2017-04-10 NOTE — Patient Instructions (Signed)
Barbara Wells, you were seen today for hospital follow-up.  I am glad that you will be following up with cardiology on the 22nd.  Continue taking your Xarelto 15 mg 2 times daily until your scheduled day to begin the 20 mg daily.  I have given you a prescription for 20 mg of Xarelto that you can pick up at your pharmacy.  I would also like to workup your thyroid and at this point it would be reasonable for you to get a thyroid uptake scan.  Please follow-up with me in 2 months and we can see how you are feeling.  Additionally please follow-up with your gynecologist to get your mammogram and your Pap smear.   If you would like to come back and get your Tdap and flu shot we are happy to get this done if you at any time.  Take care, Darcel Zick L. Myrtie SomanWarden, MD [facility] Resident PGY-2 04/10/2017 9:30 AM

## 2017-04-10 NOTE — Assessment & Plan Note (Addendum)
Thyroid mass noted on chest CT. Patient has borderline low TSH with no overt symptoms.  Thyroid ultrasound showed no nodules but diffuse heterogeneous enlargement of the left side.  Radiologist recommended follow-up ultrasound in 6 months.  Will order thyroid uptake scan for concern of malignancy.  Placed order and attempted to schedule. Patient is uncomfortable with oral contrast and stated that she will get this done if she can do IV contrast.  Will continue to discuss. Patient understands return precautions.

## 2017-04-10 NOTE — Assessment & Plan Note (Addendum)
Patient denies any chest pain, shortness of breath, lower extremity edema, pain or swelling.  She has stable vital signs.  Diagnosed with saddle pulmonary embolus with right heart strain requiring hospitalization and is currently on Xarelto 15 mg twice daily.  Will transition to 20 mg daily on 04/25/2017 and has follow-up with cardiology that same day.  Patient has been compliant with her medication and is asymptomatic.  Sent in prescription for Xarelto 20 mg daily 3 months worth.  Asked patient to follow-up in 2 months to check in.  Discussed return precautions.

## 2017-04-25 ENCOUNTER — Encounter: Payer: Self-pay | Admitting: Interventional Cardiology

## 2017-04-25 ENCOUNTER — Ambulatory Visit: Payer: BLUE CROSS/BLUE SHIELD | Admitting: Interventional Cardiology

## 2017-04-25 VITALS — BP 114/64 | HR 74 | Ht 65.0 in | Wt 209.1 lb

## 2017-04-25 DIAGNOSIS — I2510 Atherosclerotic heart disease of native coronary artery without angina pectoris: Secondary | ICD-10-CM

## 2017-04-25 DIAGNOSIS — Z7901 Long term (current) use of anticoagulants: Secondary | ICD-10-CM | POA: Diagnosis not present

## 2017-04-25 DIAGNOSIS — I2602 Saddle embolus of pulmonary artery with acute cor pulmonale: Secondary | ICD-10-CM

## 2017-04-25 NOTE — Progress Notes (Signed)
Cardiology Office Note   Date:  04/25/2017   ID:  Barbara Wells, DOB Feb 08, 1956, MRN [id number]  PCP:  Renne Musca, MD    No chief complaint on file. Coronary calcification    Wt Readings from Last 3 Encounters:  04/25/17 209 lb 1.9 oz (94.9 kg)  04/10/17 214 lb 6.4 oz (97.3 kg)  04/05/17 215 lb 11.2 oz (97.8 kg)       History of Present Illness: Barbara Wells is a 62 y.o. female who is being seen today for the evaluation of coronary calcification by CT scan at the request of Renne Musca, MD.  She had a submassive PE in 12/18.  She presented with syncope.  She was given a SL NTG.  She had right heart strain.  She is on anticoagulation.    SHe exercises regularly at the gym 5x/week.  She has had some twinges in her chest, not related to exertion.  Sx are better with exertion.  She feels worse when she lies down but this resolves quickly.    2018 echo showed: Left ventricle: The cavity size was normal. Systolic function was   normal. The estimated ejection fraction was in the range of 55%   to 60%. Wall motion was normal; there were no regional wall   motion abnormalities. Doppler parameters are consistent with   abnormal left ventricular relaxation (grade 1 diastolic   dysfunction). There was no evidence of elevated ventricular   filling pressure by Doppler parameters. - Aortic valve: There was no regurgitation. - Mitral valve: There was trivial regurgitation. - Right ventricle: The cavity size was severely dilated. Wall   thickness was normal. Systolic function was moderately reduced. - Right atrium: The atrium was normal in size. - Tricuspid valve: There was moderate regurgitation. - Pulmonic valve: There was trivial regurgitation. - Pulmonary arteries: Systolic pressure was mildly to moderately   increased. PA peak pressure: 41 mm Hg (S). - Inferior vena cava: The vessel was dilated. The respirophasic   diameter changes were blunted (< 50%),  consistent with elevated   central venous pressure. - Pericardium, extracardiac: There was no pericardial effusion.  SHe has a thyroid mass as well and this is being followed.  Since PE, Denies :  Dizziness. Leg edema. Nitroglycerin use. Orthopnea. Palpitations. Paroxysmal nocturnal dyspnea. Shortness of breath. Syncope.   Rare shortlived twinges in chest.     Past Medical History:  Diagnosis Date  . Arthritis   . Asthma 05/31/2016  . Chest pain   . Glaucoma, right eye 06/07/2012  . Left thyroid nodule   . Obesity (BMI 30.0-34.9) 05/31/2016  . Pain   . 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 Seizure-like activity (HCC)   . Shortness of breath     No past surgical history on file.   Current Outpatient Medications  Medication Sig Dispense Refill  . acetaminophen (TYLENOL) 500 MG tablet Take 1,000 mg by mouth every 6 (six) hours as needed for headache (pain).     Marland Kitchen albuterol (PROVENTIL HFA;VENTOLIN HFA) 108 (90 Base) MCG/ACT inhaler Inhale 2 puffs into the lungs every 6 (six) hours as needed for wheezing or shortness of breath. 1 Inhaler 0  . ibuprofen (ADVIL,MOTRIN) 200 MG tablet Take 200 mg by mouth every 6 (six) hours as needed for headache (pain).    Marland Kitchen latanoprost (XALATAN) 0.005 % ophthalmic solution Place 1 drop into both eyes at bedtime.  1  . Multiple Vitamin (MULTIVITAMIN WITH MINERALS) TABS tablet  Take 1 tablet by mouth at bedtime.    . rivaroxaban (XARELTO) 20 MG TABS tablet Take 1 tablet (20 mg total) by mouth daily with supper. 30 tablet 3  . Rivaroxaban 15 & 20 MG TBPK Take as directed on package: Start with one 15mg  tablet by mouth twice a day with food. On Day 22, switch to one 20mg  tablet once a day with food. 51 each 0   No current facility-administered medications for this visit.     Allergies:   Griseofulvin    Social History:  The patient  reports that  has never smoked. she has never used smokeless tobacco. She reports that she does not drink alcohol  or use drugs.   Family History:  The patient's family history includes ADD / ADHD in her son; Heart disease in her father; Hernia in her son; Hypertension in her mother; Kidney disease in her brother; Stroke in her mother.    ROS:  Please see the history of present illness.   Otherwise, review of systems are positive for recent PE.   All other systems are reviewed and negative.    PHYSICAL EXAM: VS:  BP 114/64 (BP Location: Right Arm, Patient Position: Sitting, Cuff Size: Normal)   Pulse 74   Ht 5\' 5"  (1.651 m)   Wt 209 lb 1.9 oz (94.9 kg)   SpO2 98%   BMI 34.80 kg/m  , BMI Body mass index is 34.8 kg/m. GEN: Well nourished, well developed, in no acute distress  HEENT: normal  Neck: no JVD, carotid bruits, or masses Cardiac: RRR; no murmurs, rubs, or gallops,no edema  Respiratory:  clear to auscultation bilaterally, normal work of breathing GI: soft, nontender, nondistended, + BS MS: no deformity or atrophy  Skin: warm and dry, no rash Neuro:  Strength and sensation are intact Psych: euthymic mood, full affect   EKG:   The ekg ordered  in December 2018 demonstrates sinus tachycardia, no ST segment changes   Recent Labs: 04/02/2017: Magnesium 1.9; TSH 0.382 04/04/2017: ALT 13; BUN 10; Creatinine, Ser 0.69; Potassium 4.1; Sodium 135 04/05/2017: Hemoglobin 11.7; Platelets 147   Lipid Panel    Component Value Date/Time   CHOL 142 04/02/2017 2339   TRIG 66 04/02/2017 2339   HDL 31 (L) 04/02/2017 2339   CHOLHDL 4.6 04/02/2017 2339   VLDL 13 04/02/2017 2339   LDLCALC 98 04/02/2017 2339     Other studies Reviewed: Additional studies/ records that were reviewed today with results demonstrating: [facility] records and CT report reviewed.   ASSESSMENT AND PLAN:  1. Coronary calcification: They appear minimal.  No anginal symptoms.  She has some twinges in her chest that are quite atypical.  No symptoms related to exertion.  I do not think her coronary circulation is an issue at  this time.  Her LV function was normal.  She should continue with management of her pulmonary embolism with anticoagulation for at least 6 months. 2. Anticoagulated: No bleeding problems.  Continue for at least 6 months. 3. Consider repeating echocardiogram in 6 months to check on right heart function. 4. LDL reviewed and was 98 in 12/18.   Current medicines are reviewed at length with the patient today.  The patient concerns regarding her medicines were addressed.  The following changes have been made:  No change  Labs/ tests ordered today include:  No orders of the defined types were placed in this encounter.   Recommend 150 minutes/week of aerobic exercise Low fat, low carb, high  fiber diet recommended  Disposition:   FU in 6 months   Signed, Lance MussJayadeep Arlena Marsan, MD  04/25/2017 10:18 AM    [facility] [address], [place], [place]  [address] Phone: [phone]; Fax: [phone]

## 2017-04-25 NOTE — Patient Instructions (Signed)

## 2017-05-10 ENCOUNTER — Other Ambulatory Visit: Payer: Self-pay | Admitting: Family Medicine

## 2017-05-10 ENCOUNTER — Telehealth: Payer: Self-pay | Admitting: Family Medicine

## 2017-05-10 DIAGNOSIS — E041 Nontoxic single thyroid nodule: Secondary | ICD-10-CM

## 2017-05-10 NOTE — Telephone Encounter (Signed)
Carrie from radiology called because the current orders for the patient thyroid test needs to changed to Thyroid uptake and scan. Instead of just uptake. Please call her with other questions [phone]. Myriam Jacobsonjw

## 2017-05-10 NOTE — Telephone Encounter (Signed)
Order placed

## 2017-05-15 ENCOUNTER — Other Ambulatory Visit (HOSPITAL_COMMUNITY): Payer: BLUE CROSS/BLUE SHIELD

## 2017-05-16 ENCOUNTER — Encounter (HOSPITAL_COMMUNITY): Payer: BLUE CROSS/BLUE SHIELD

## 2017-05-16 ENCOUNTER — Other Ambulatory Visit (HOSPITAL_COMMUNITY): Payer: BLUE CROSS/BLUE SHIELD

## 2017-05-17 ENCOUNTER — Encounter (HOSPITAL_COMMUNITY): Payer: BLUE CROSS/BLUE SHIELD

## 2017-06-27 ENCOUNTER — Ambulatory Visit (HOSPITAL_COMMUNITY): Payer: BLUE CROSS/BLUE SHIELD

## 2017-06-28 ENCOUNTER — Other Ambulatory Visit (HOSPITAL_COMMUNITY): Payer: BLUE CROSS/BLUE SHIELD

## 2017-09-06 ENCOUNTER — Encounter: Payer: Self-pay | Admitting: Interventional Cardiology

## 2017-09-06 ENCOUNTER — Other Ambulatory Visit: Payer: Self-pay | Admitting: Family Medicine

## 2017-09-06 ENCOUNTER — Ambulatory Visit: Payer: BLUE CROSS/BLUE SHIELD | Admitting: Interventional Cardiology

## 2017-09-06 VITALS — BP 110/66 | HR 80 | Ht 65.0 in | Wt 188.0 lb

## 2017-09-06 DIAGNOSIS — Z7901 Long term (current) use of anticoagulants: Secondary | ICD-10-CM | POA: Diagnosis not present

## 2017-09-06 DIAGNOSIS — I2510 Atherosclerotic heart disease of native coronary artery without angina pectoris: Secondary | ICD-10-CM

## 2017-09-06 NOTE — Telephone Encounter (Signed)
Patient came by office request refill on the RX Xarelto . Patient is totally out. [facility]. If any questions call [phone]

## 2017-09-06 NOTE — Patient Instructions (Signed)

## 2017-09-06 NOTE — Progress Notes (Signed)
Cardiology Office Note   Date:  09/06/2017   ID:  Barbara Wells, DOB 17-Feb-1956, MRN [id number]  PCP:  Wendee BeaversMcMullen, David J, DO    No chief complaint on file.  Chest pain/CAD  Wt Readings from Last 3 Encounters:  09/06/17 188 lb (85.3 kg)  04/25/17 209 lb 1.9 oz (94.9 kg)  04/10/17 214 lb 6.4 oz (97.3 kg)       History of Present Illness: Barbara Wells is a 62 y.o. female  had a submassive PE in 12/18.  She presented with syncope.  She was given a SL NTG.  She had right heart strain.  She is on anticoagulation.    She was seen for coronary artery calcification.  2018 echo showed: Left ventricle: The cavity size was normal. Systolic function was normal. The estimated ejection fraction was in the range of 55% to 60%. Wall motion was normal; there were no regional wall motion abnormalities. Doppler parameters are consistent with abnormal left ventricular relaxation (grade 1 diastolic dysfunction). There was no evidence of elevated ventricular filling pressure by Doppler parameters. - Aortic valve: There was no regurgitation. - Mitral valve: There was trivial regurgitation. - Right ventricle: The cavity size was severely dilated. Wall thickness was normal. Systolic function was moderately reduced. - Right atrium: The atrium was normal in size. - Tricuspid valve: There was moderate regurgitation. - Pulmonic valve: There was trivial regurgitation. - Pulmonary arteries: Systolic pressure was mildly to moderately increased. PA peak pressure: 41 mm Hg (S). - Inferior vena cava: The vessel was dilated. The respirophasic diameter changes were blunted (<50%), consistent with elevated central venous pressure. - Pericardium, extracardiac: There was no pericardial effusion.  SHe has a thyroid mass as well and this is being followed.   She reports some rare chest pain, once/week, that lasts 3-4 seconds.  It goes away on its own.  Not related to exertion.     She goes to the gym 5x/week and rids a stationary bike.  No problems with that activity.   Past Medical History:  Diagnosis Date  . Arthritis   . Asthma 05/31/2016  . Chest pain   . Glaucoma, right eye 06/07/2012  . Left thyroid nodule   . Obesity (BMI 30.0-34.9) 05/31/2016  . Pain   . 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 Seizure-like activity (HCC)   . Shortness of breath     History reviewed. No pertinent surgical history.   Current Outpatient Medications  Medication Sig Dispense Refill  . acetaminophen (TYLENOL) 500 MG tablet Take 1,000 mg by mouth every 6 (six) hours as needed for headache (pain).     Marland Kitchen. albuterol (PROVENTIL HFA;VENTOLIN HFA) 108 (90 Base) MCG/ACT inhaler Inhale 2 puffs into the lungs every 6 (six) hours as needed for wheezing or shortness of breath. 1 Inhaler 0  . ibuprofen (ADVIL,MOTRIN) 200 MG tablet Take 200 mg by mouth every 6 (six) hours as needed for headache (pain).    Marland Kitchen. latanoprost (XALATAN) 0.005 % ophthalmic solution Place 1 drop into both eyes at bedtime.  1  . Multiple Vitamin (MULTIVITAMIN WITH MINERALS) TABS tablet Take 1 tablet by mouth at bedtime.    . rivaroxaban (XARELTO) 20 MG TABS tablet Take 1 tablet (20 mg total) by mouth daily with supper. 30 tablet 3  . Rivaroxaban 15 & 20 MG TBPK Take as directed on package: Start with one 15mg  tablet by mouth twice a day with food. On Day 22, switch to one 20mg  tablet  once a day with food. 51 each 0   No current facility-administered medications for this visit.     Allergies:   Griseofulvin    Social History:  The patient  reports that she has never smoked. She has never used smokeless tobacco. She reports that she does not drink alcohol or use drugs.   Family History:  The patient's family history includes ADD / ADHD in her son; Heart disease in her father; Hernia in her son; Hypertension in her mother; Kidney disease in her brother; Stroke in her mother.    ROS:  Please see the history  of present illness.   Otherwise, review of systems are positive for intentional weight loss.   All other systems are reviewed and negative.    PHYSICAL EXAM: VS:  BP 110/66   Pulse 80   Ht 5\' 5"  (1.651 m)   Wt 188 lb (85.3 kg)   SpO2 98%   BMI 31.28 kg/m  , BMI Body mass index is 31.28 kg/m. GEN: Well nourished, well developed, in no acute distress  HEENT: normal  Neck: no JVD, carotid bruits, or masses Cardiac: RRR; no murmurs, rubs, or gallops,no edema  Respiratory:  clear to auscultation bilaterally, normal work of breathing GI: soft, nontender, nondistended, + BS MS: no deformity or atrophy  Skin: warm and dry, no rash Neuro:  Strength and sensation are intact Psych: euthymic mood, full affect   Recent Labs: 04/02/2017: Magnesium 1.9; TSH 0.382 04/04/2017: ALT 13; BUN 10; Creatinine, Ser 0.69; Potassium 4.1; Sodium 135 04/05/2017: Hemoglobin 11.7; Platelets 147   Lipid Panel    Component Value Date/Time   CHOL 142 04/02/2017 2339   TRIG 66 04/02/2017 2339   HDL 31 (L) 04/02/2017 2339   CHOLHDL 4.6 04/02/2017 2339   VLDL 13 04/02/2017 2339   LDLCALC 98 04/02/2017 2339     Other studies Reviewed: Additional studies/ records that were reviewed today with results demonstrating: Prior hospital records reviewed.   ASSESSMENT AND PLAN:  1. Coronary calcification: No anginal pain.  No further w/u at this time.   2. Anticoagulated: Finished her supply of Xarelto.  Will defer to Dr. Myrtie Soman Dr. Abelardo Diesel as to  how long he wanted to treat her with anticoagulation.  She is going to there office after she leaves here.  I would consider at least 6 months since this was a large PE. 3. Prior abnormal echo.  No sx. Exercising well.  Will hold on repeat.    4. Obesity: I congratulated her on her lifestyle changes that led to weight loss.    Current medicines are reviewed at length with the patient today.  The patient concerns regarding her medicines were addressed.  The following  changes have been made:  No change  Labs/ tests ordered today include:  No orders of the defined types were placed in this encounter.   Recommend 150 minutes/week of aerobic exercise Low fat, low carb, high fiber diet recommended  Disposition:   FU in  1 year   Signed, Lance Muss, MD  09/06/2017 1:46 PM    [facility] [address] [place], [place], [place]  [address] Phone: [phone]; Fax: [phone]

## 2017-09-07 MED ORDER — RIVAROXABAN 20 MG PO TABS: 20.0000 mg | ORAL_TABLET | Freq: Every day | ORAL | 3 refills | Status: DC

## 2017-09-28 ENCOUNTER — Encounter: Payer: Self-pay | Admitting: Family Medicine

## 2017-09-28 ENCOUNTER — Ambulatory Visit: Payer: BLUE CROSS/BLUE SHIELD | Admitting: Family Medicine

## 2017-09-28 VITALS — BP 98/60 | HR 69 | Temp 98.3°F | Ht 65.0 in | Wt 184.6 lb

## 2017-09-28 DIAGNOSIS — E041 Nontoxic single thyroid nodule: Secondary | ICD-10-CM

## 2017-09-28 DIAGNOSIS — H409 Unspecified glaucoma: Secondary | ICD-10-CM

## 2017-09-28 DIAGNOSIS — I2602 Saddle embolus of pulmonary artery with acute cor pulmonale: Secondary | ICD-10-CM

## 2017-09-28 MED ORDER — LATANOPROST 0.005 % OP SOLN: 1.0000 [drp] | Freq: Every day | OPHTHALMIC | 1 refills | Status: DC

## 2017-09-28 NOTE — Progress Notes (Signed)
   Subjective   Patient ID: Barbara Wells    DOB: 04-Jul-1955, 62 y.o. female   MRN: [id number]  CC: "Need eyedrop refill"  HPI: Barbara AguasSarah E Wells is a 62 y.o. female who presents to clinic today for the following:  Thyroid nodule: Patient diagnosed with left-sided thyroid nodule.  TSH was within normal limits, however thyroid ultrasound concerning for heterogeneous mass measuring 7 x 4 x 6 cm.  Patient failed to follow-up with thyroid uptake scan due to concern for contrast ingestion.  She denies any changes to her thyroid mass but feels that she may have some difficulty with swallowing solid.  Patient denies shortness of breath, hoarseness, feelings of fatigue, palpitations.  Pulmonary embolism: Patient diagnosed with provoked massive pulmonary embolism on 04/02/2018.  This was her first VTE.  Patient has been adherent to Xarelto use.  She denies melena or hematochezia, bruising, hematuria.  She will be completing her 6 months treatment at the end of June.  Eyedrop refill: Patient monitored by ophthalmologist annually.  Has appointment in approximately 1 month.  Needs processed drops today.  No acute changes to her vision.  ROS: see HPI for pertinent.  PMFSH: H/o seizure-like activity, h/o PE, obesity, CAD, L-thyroid nodule, glaucoma, asthma. Surgical history unremarkable. Family history stroke, HTN, heart disease, CKD. Smoking status reviewed. Medications reviewed.  Objective   BP 98/60   Pulse 69   Temp 98.3 F (36.8 C) (Oral)   Ht 5\' 5"  (1.651 m)   Wt 184 lb 9.6 oz (83.7 kg)   SpO2 97%   BMI 30.72 kg/m  Vitals and nursing note reviewed.  General: well nourished, well developed, NAD with non-toxic appearance HEENT: normocephalic, atraumatic, moist mucous membranes, no proptosis Neck: supple, non-tender without lymphadenopathy, large palpable mass on left inferior thyroid Cardiovascular: regular rate and rhythm without murmurs, rubs, or gallops Lungs: clear to auscultation  bilaterally with normal work of breathing Abdomen: soft, non-tender, non-distended, normoactive bowel sounds Skin: warm, dry, no rashes or lesions, cap refill < 2 seconds Extremities: warm and well perfused, normal tone, no edema  Assessment & Plan   Left thyroid nodule Chronic.  Uncertain etiology, heterogeneous on ultrasound.  Will need further investigation with repeat ultrasound and repeat thyroid levels.  May need to be further investigated with FNA and considered for thyroidectomy. - Checking TSH, free T4, T3 - Repeat thyroid ultrasound - RTC 227-month  Saddle pulmonary embolus (HCC) Chronic.  Patient completed 6 months of Xarelto.  Remains asymptomatic. - Discontinuing Xarelto after 10/01/2017 - Reviewed return precautions  Glaucoma, right eye Chronic.  Managed by ophthalmology.  Here today for eyedrop refill. - Refill latanoprost  Orders Placed This Encounter  Procedures  . US THYROID    Standing Status:   Future    Standing Expiration Date:   11/29/2018    Order Specific Question:   Reason for Exam (SYMPTOM  OR DIAGNOSIS REQUIRED)    Answer:   Thyroid nodule    Order Specific Question:   Preferred imaging location?    [facility]  . TSH  . T4, Free  . T3   Meds ordered this encounter  Medications  . latanoprost (XALATAN) 0.005 % ophthalmic solution    Sig: Place 1 drop into both eyes at bedtime.    Dispense:  2.5 mL    Refill:  1    Durward Parcelavid McMullen, DO [facility], PGY-2 09/28/2017, 12:09 PM

## 2017-09-28 NOTE — Assessment & Plan Note (Signed)
Chronic.  Patient completed 6 months of Xarelto.  Remains asymptomatic. - Discontinuing Xarelto after 10/01/2017 - Reviewed return precautions

## 2017-09-28 NOTE — Assessment & Plan Note (Signed)
Chronic.  Managed by ophthalmology.  Here today for eyedrop refill. - Refill latanoprost

## 2017-09-28 NOTE — Assessment & Plan Note (Signed)
Chronic.  Uncertain etiology, heterogeneous on ultrasound.  Will need further investigation with repeat ultrasound and repeat thyroid levels.  May need to be further investigated with FNA and considered for thyroidectomy. - Checking TSH, free T4, T3 - Repeat thyroid ultrasound - RTC 5732-month

## 2017-09-28 NOTE — Patient Instructions (Signed)
Thank you for coming in to see us today. Please see below to review our plan for today's visit.  1.  You can stop your Xarelto after completing 6 months of treatment.  Discontinue this medication on July 1.  If you develop chest pain or shortness of breath or lower extremity pain or swelling, seek medical attention immediately.  It is unlikely that you would have a recurrence of your blood clots. 2.  I sent in a refill for your eyedrops.  Follow-up with your ophthalmologist. 3.  We need to follow-up your thyroid nodule.  I have ordered some thyroid function labs and will call you if there is any abnormalities, otherwise you will receive results in the mail.  We also need to perform another thyroid ultrasound to check if your nodule has changed.  I will call you after we received the results.  If this has enlarged or has remained unchanged, will need to see a specialist to have this further investigated.  Please call the clinic at [phone] if your symptoms worsen or you have any concerns. It was our pleasure to serve you.  Durward Parcelavid Payslie Mccaig, DO [facility], PGY-2

## 2017-09-29 ENCOUNTER — Encounter: Payer: Self-pay | Admitting: Family Medicine

## 2017-09-29 LAB — TSH: TSH: 0.861 u[IU]/mL (ref 0.450–4.500)

## 2017-09-29 LAB — T3: T3 TOTAL: 106 ng/dL (ref 71–180)

## 2017-09-29 LAB — T4, FREE: FREE T4: 1.33 ng/dL (ref 0.82–1.77)

## 2017-10-03 ENCOUNTER — Ambulatory Visit (HOSPITAL_COMMUNITY): Payer: BLUE CROSS/BLUE SHIELD

## 2018-06-22 ENCOUNTER — Telehealth: Payer: Self-pay | Admitting: Family Medicine

## 2018-06-22 MED ORDER — LATANOPROST 0.005 % OP SOLN: 1.0000 [drp] | Freq: Every day | OPHTHALMIC | 1 refills | Status: AC

## 2018-06-22 NOTE — Telephone Encounter (Signed)
[facility] Telemedicine Visit  Patient calling for refill of lantoprost eye drops for glaucoma. No other concerns. Refill sent.   Time spent on phone with patient: 5 minutes. No charge for this call.   Routed note to preceptor, Dr. Pollie Meyer

## 2018-09-07 ENCOUNTER — Ambulatory Visit: Payer: BLUE CROSS/BLUE SHIELD

## 2018-11-04 IMAGING — CT CT ANGIO CHEST
2 of 8 series · 18 of 46 positions shown · IV contrast (OMNI)
Comparison: Chest radiograph performed earlier today at [DATE] p.m.

CLINICAL DATA: Status post 2 seizures. Urinary incontinence.
Diaphoresis and midsternal chest pain, acute onset.

EXAM:
CT ANGIOGRAPHY CHEST WITH CONTRAST
TECHNIQUE: Multidetector CT imaging of the chest was performed using the
standard protocol during bolus administration of intravenous
contrast. Multiplanar CT image reconstructions and MIPs were
obtained to evaluate the vascular anatomy.
CONTRAST:  100mL 1AA2RU-91E IOPAMIDOL (1AA2RU-91E) INJECTION 76%

[Series 6: thins · axial · 0.68mm/px · z∈[+1088,+1360]mm · 15 of 300 slices shown]
[im 14/300  lung]
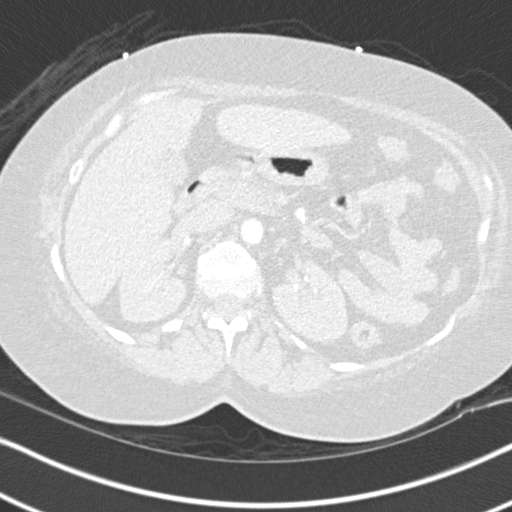
[im 41/300  soft-tissue]
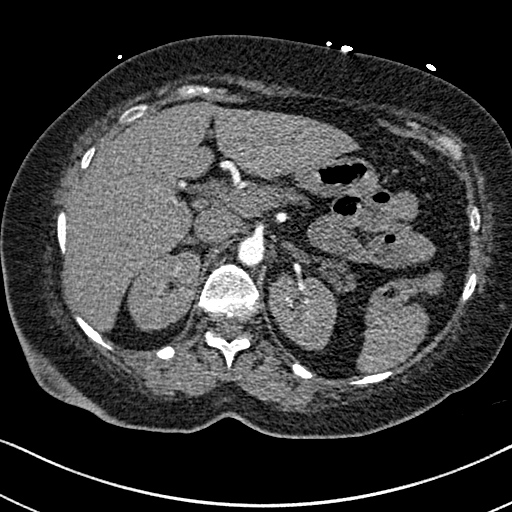
[im 55/300  lung]
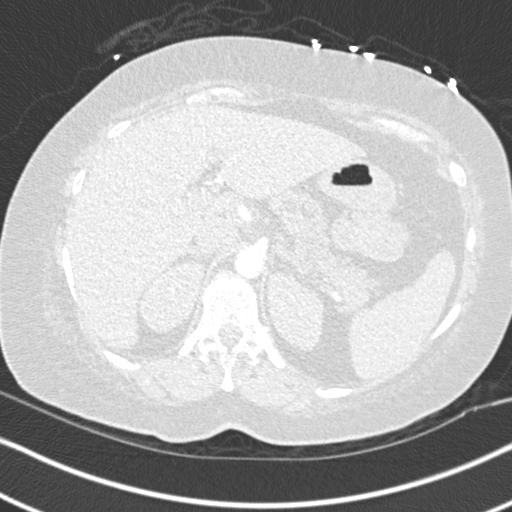
[im 68/300  soft-tissue]
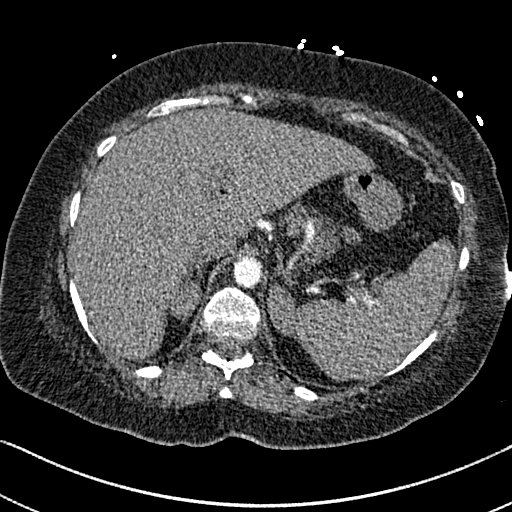
[im 96/300  lung]
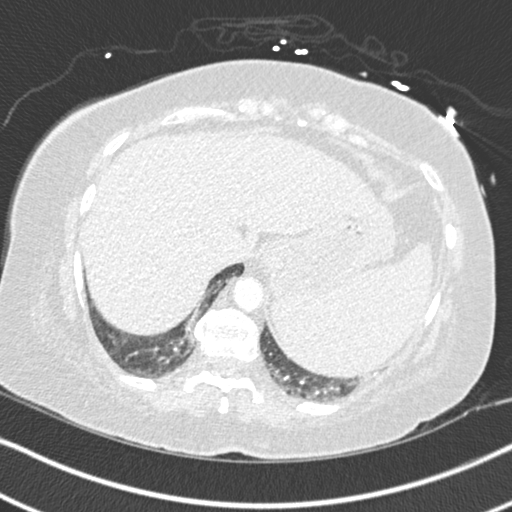
[im 109/300  soft-tissue]
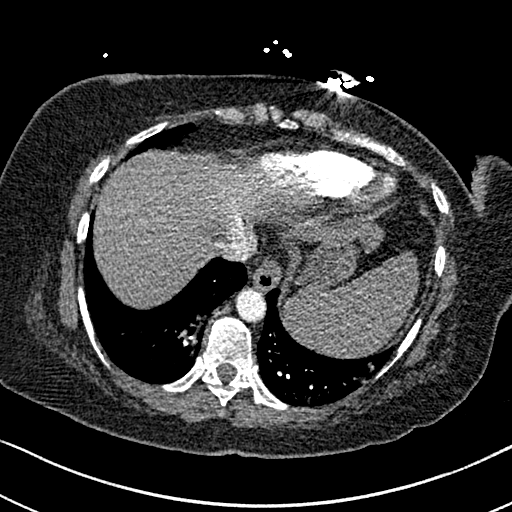
[im 136/300  lung]
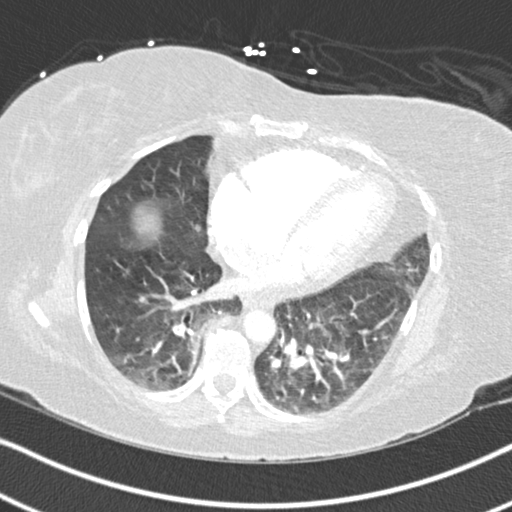
[im 150/300  soft-tissue]
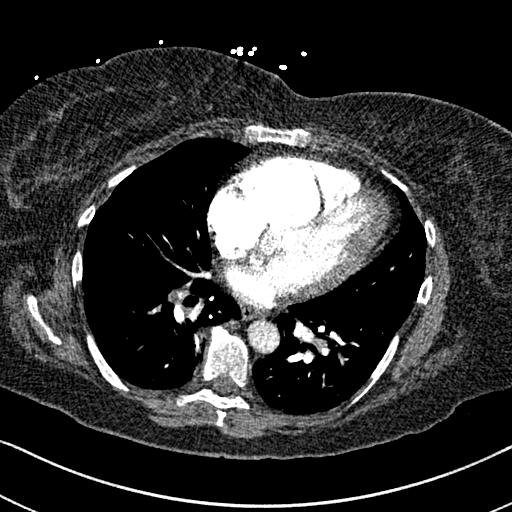
[im 164/300  lung]
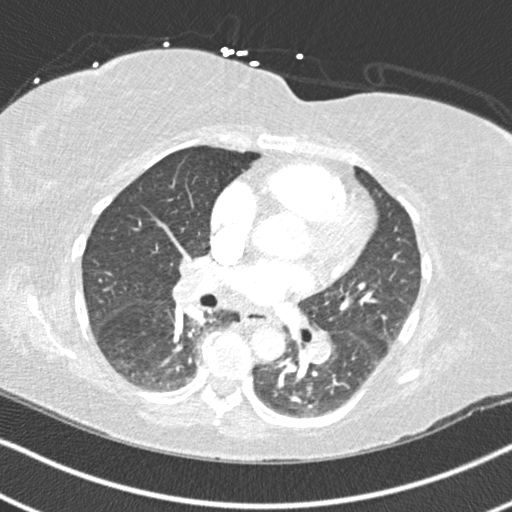
[im 191/300  soft-tissue]
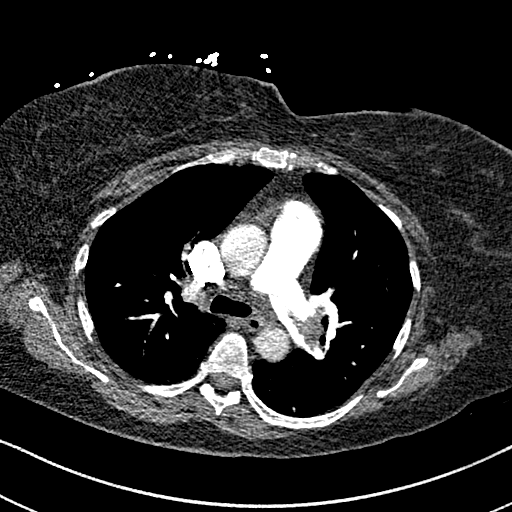
[im 204/300  lung]
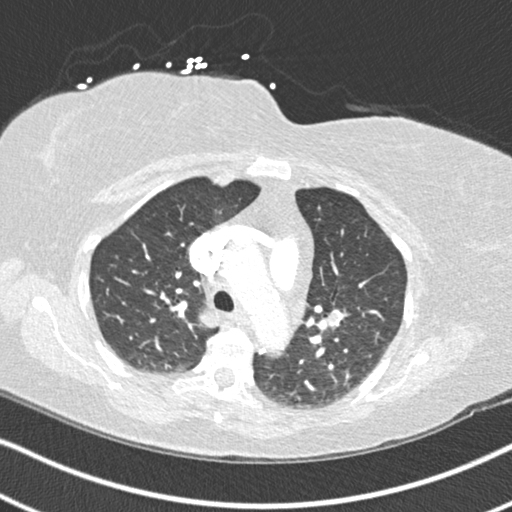
[im 232/300  soft-tissue]
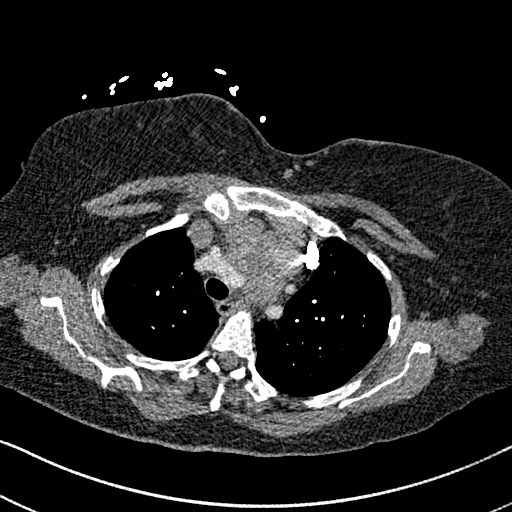
[im 245/300  lung]
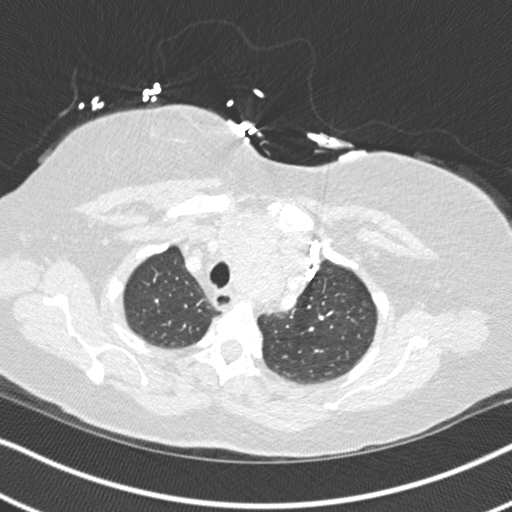
[im 259/300  soft-tissue]
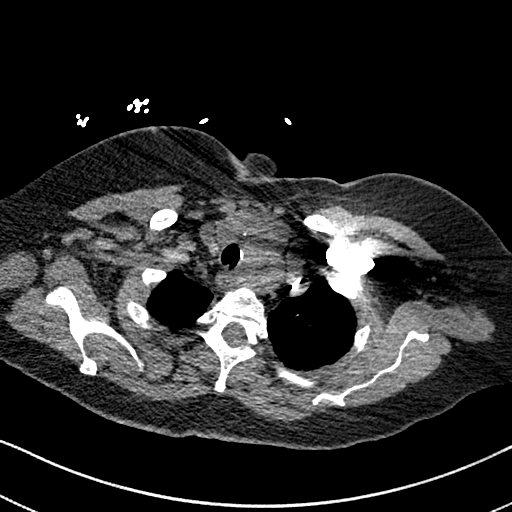
[im 286/300  lung]
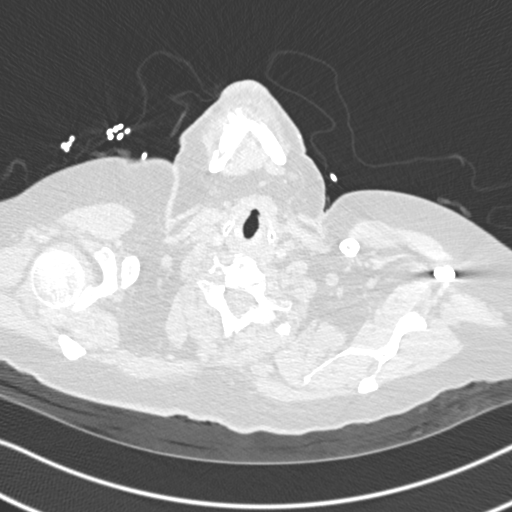

[Series 8: coronal mpr · coronal · 0.59mm/px · 3 of 151 slices shown]
[im 38/151  soft-tissue]
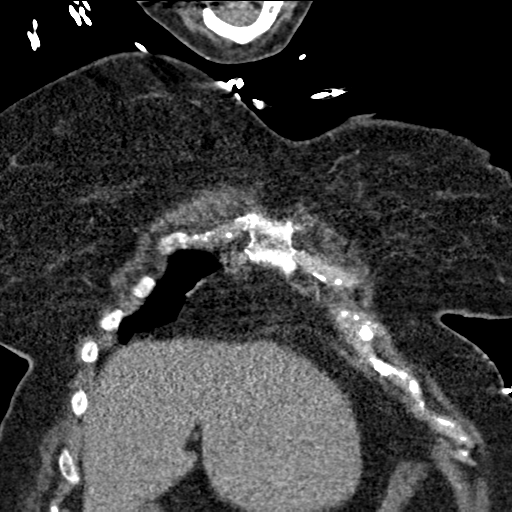
[im 76/151  soft-tissue]
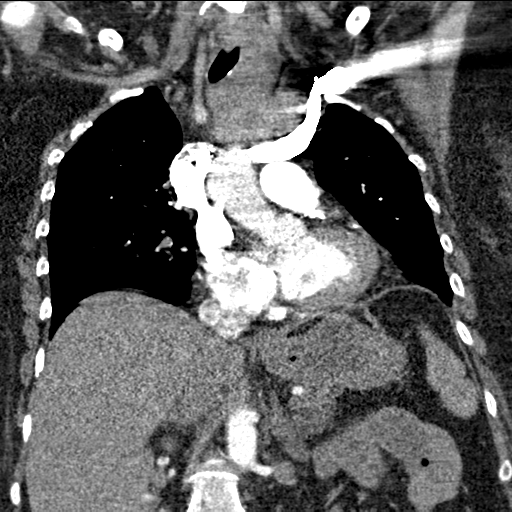
[im 113/151  soft-tissue]
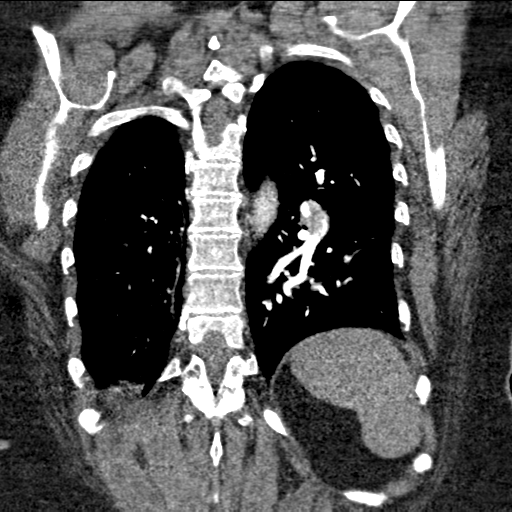

[18 of 46 positions shown; findings below may reference images not displayed]

FINDINGS: Cardiovascular: A large saddle pulmonary embolus is noted, extending
bilaterally into all lobes of both lungs. The RV/LV ratio is 1.35,
compatible with right heart strain and at least submassive pulmonary
embolus.

The heart is normal in size. Scattered coronary artery
calcifications are seen. The great vessels are grossly unremarkable.

Mediastinum/Nodes: No mediastinal lymphadenopathy is seen. No
pericardial effusion is identified.

A large multilobulated mildly heterogeneous mass is seen arising at
the left thyroid lobe, measuring 6.8 x 5.8 x 4.2 cm. Malignancy
cannot be excluded. No axillary lymphadenopathy is seen.

Lungs/Pleura: Mild bilateral dependent subsegmental atelectasis is
noted. The lungs are otherwise clear. No pleural effusion or
pneumothorax is seen.

Upper Abdomen: The visualized portions of the liver and spleen are
unremarkable. The visualized portions of the pancreas, adrenal
glands and kidneys are within normal limits.

Musculoskeletal: No acute osseous abnormalities are identified. The
visualized musculature is unremarkable in appearance.

Review of the MIP images confirms the above findings.
IMPRESSION: 1. Large saddle pulmonary embolus, extending bilaterally into all
lobes of both lungs. CT evidence of right heart strain (RV/LV Ratio
= 1.35) consistent with at least submassive (intermediate risk) PE.
The presence of right heart strain has been associated with an
increased risk of morbidity and mortality. Please activate Code PE
by paging [phone].
2. Large multilobulated mildly heterogeneous mass arising at the
left thyroid lobe, measuring 6.8 x 5.8 x 4.2 cm. Malignancy cannot
be excluded. Recommend further evaluation with thyroid ultrasound.
If patient is clinically hyperthyroid, consider nuclear medicine
thyroid uptake and scan.
3. Mild bilateral dependent subsegmental atelectasis. Lungs
otherwise clear.
4. Scattered coronary artery calcifications.

Critical Value/emergent results were called by telephone at the time
of interpretation on 04/02/2017 at [DATE] to Dr. MERGIMM DAL, who
verbally acknowledged these results.

## 2019-01-31 ENCOUNTER — Encounter: Payer: BLUE CROSS/BLUE SHIELD | Admitting: Family Medicine

## 2019-03-06 ENCOUNTER — Telehealth: Payer: Self-pay

## 2019-03-06 ENCOUNTER — Other Ambulatory Visit: Payer: Self-pay

## 2019-03-06 ENCOUNTER — Ambulatory Visit (INDEPENDENT_AMBULATORY_CARE_PROVIDER_SITE_OTHER): Payer: Medicare Other | Admitting: Family Medicine

## 2019-03-06 VITALS — BP 105/75 | HR 78 | Wt 210.0 lb

## 2019-03-06 DIAGNOSIS — E041 Nontoxic single thyroid nodule: Secondary | ICD-10-CM

## 2019-03-06 DIAGNOSIS — E559 Vitamin D deficiency, unspecified: Secondary | ICD-10-CM

## 2019-03-06 DIAGNOSIS — E049 Nontoxic goiter, unspecified: Secondary | ICD-10-CM

## 2019-03-06 DIAGNOSIS — E669 Obesity, unspecified: Secondary | ICD-10-CM | POA: Diagnosis not present

## 2019-03-06 DIAGNOSIS — Z0000 Encounter for general adult medical examination without abnormal findings: Secondary | ICD-10-CM

## 2019-03-06 DIAGNOSIS — E66811 Obesity, class 1: Secondary | ICD-10-CM

## 2019-03-06 LAB — POCT GLYCOSYLATED HEMOGLOBIN (HGB A1C): HbA1c, POC (prediabetic range): 6 % (ref 5.7–6.4)

## 2019-03-06 NOTE — Assessment & Plan Note (Signed)
Patient never got follow up thyroid scan so will order and schedule that for her today. - Repeat TSH, Free T4, and T3 today as well. All normal at last visit in 2019.

## 2019-03-06 NOTE — Progress Notes (Signed)
Subjective: Chief Complaint  Patient presents with  . Annual Exam     HPI: Barbara Wells is a 63 y.o. presenting to clinic today to discuss the following:  Thyroid Nodule/Goiter Patient has a history of a goiter with normal thyroid labs. She got a thyroid ultrasound scan that showed a goiter and recommended a follow up scan in 6 months. She never did get this scan. She has been taking her Iodine tablet and says it has "almost gone away". She has no difficulty breathing, eating, or swallowing.  Otherwise, she is feeling well and has no complaints. No SOB, no chest pain, no abdominal pain, no nausea, vomiting, or blood in the stool.   Health Maintenance: Ordered DEXA scan today. declines flu and PVC23 vaccine, declines colonoscopy, and mammogram. Reports she will get Pap at her OB/GYN.     ROS noted in HPI.    Social History   Tobacco Use  Smoking Status Never Smoker  Smokeless Tobacco Never Used    Objective: BP 105/75   Pulse 78   Wt 210 lb (95.3 kg)   SpO2 98%   BMI 34.95 kg/m  Vitals and nursing notes reviewed  Physical Exam Gen: Alert and Oriented x 3, NAD Eyes: Glaucoma of the right eye Neck: trachea midline, no palpable mass or thyroid nodule, no thyroidmegaly, no LAD CV: RRR, no murmurs, normal S1, S2 split Resp: CTAB, no wheezing, rales, or rhonchi, comfortable work of breathing Ext: no clubbing, cyanosis, or edema Skin: warm, dry, intact, no rashes  Results for orders placed or performed in visit on 03/06/19 (from the past 72 hour(s))  HgB A1c     Status: None   Collection Time: 03/06/19 11:44 AM  Result Value Ref Range   Hemoglobin A1C     HbA1c POC (<> result, manual entry)     HbA1c, POC (prediabetic range) 6.0 5.7 - 6.4 %   HbA1c, POC (controlled diabetic range)      Assessment/Plan:  Obesity (BMI 30.0-34.9) - Repeat lipid panel and A1c as it has been over 1 year since her last appointment. Last A1c was 6.2 putting her in the prediabetic  range. - A1c today at 6.0% so still in prediabetes but not requiring medication. - Will follow up Lipid panel and calculate 63yr ASCVD risk score and start statin if appropriate  Left thyroid nodule Patient never got follow up thyroid scan so will order and schedule that for her today. - Repeat TSH, Free T4, and T3 today as well. All normal at last visit in 2019.  Dexa scan ordered for patient for routine healthcare maintenance.   PATIENT EDUCATION PROVIDED: See AVS    Diagnosis and plan along with any newly prescribed medication(s) were discussed in detail with this patient today. The patient verbalized understanding and agreed with the plan. Patient advised if symptoms worsen return to clinic or ER.    Orders Placed This Encounter  Procedures  . US THYROID    Standing Status:   Future    Standing Expiration Date:   06/04/2019    Order Specific Question:   Reason for Exam (SYMPTOM  OR DIAGNOSIS REQUIRED)    Answer:   History of goiter    Order Specific Question:   Preferred imaging location?    Answer:   [id number]  . DG Bone Density    Standing Status:   Future    Standing Expiration Date:   06/04/2019    Order Specific Question:  Reason for Exam (SYMPTOM  OR DIAGNOSIS REQUIRED)    Answer:   healthcare maintenace    Order Specific Question:   Preferred imaging location?    Answer:   [facility]  . TSH + free T4  . T3, Free  . Lipid panel    Order Specific Question:   Has the patient fasted?    Answer:   No  . HgB A1c    No orders of the defined types were placed in this encounter.    Harolyn Rutherford, DO 03/06/2019, 10:45 AM PGY-3 [place]

## 2019-03-06 NOTE — Telephone Encounter (Signed)
Called patient with appointment for Tyroid ultrasound  on 03/12/2019 at [facility].  Arrival time is 1215.  Barbara Wells, [place]

## 2019-03-06 NOTE — Patient Instructions (Signed)
It was great to meet you today! Thank you for letting me participate in your care!  Today, we discussed your healthcare maintenance and I have obtained several labs today. If anything is abnormal I will call you. If everything is normal you will receive a letter.  Be well, Harolyn Rutherford, DO PGY-3, [facility]

## 2019-03-06 NOTE — Assessment & Plan Note (Signed)
-   Repeat lipid panel and A1c as it has been over 1 year since her last appointment. Last A1c was 6.2 putting her in the prediabetic range. - A1c today at 6.0% so still in prediabetes but not requiring medication. - Will follow up Lipid panel and calculate 60yr ASCVD risk score and start statin if appropriate

## 2019-03-06 NOTE — Progress Notes (Signed)
Cardiology Office Note   Date:  03/08/2019   ID:  Barbara Wells, DOB 1955-11-19, MRN [id number]  PCP:  Arlyce Harman, DO    No chief complaint on file.    Wt Readings from Last 3 Encounters:  03/08/19 212 lb 12.8 oz (96.5 kg)  03/06/19 210 lb (95.3 kg)  09/28/17 184 lb 9.6 oz (83.7 kg)       History of Present Illness: Barbara Wells is a 63 y.o. female  had a submassive PE in 12/18. She presented with syncope. She was given a SL NTG. She had right heart strain. She is on anticoagulation.   She was seen for coronary artery calcification.  2018 echo showed: Left ventricle: The cavity size was normal. Systolic function was normal. The estimated ejection fraction was in the range of 55% to 60%. Wall motion was normal; there were no regional wall motion abnormalities. Doppler parameters are consistent with abnormal left ventricular relaxation (grade 1 diastolic dysfunction). There was no evidence of elevated ventricular filling pressure by Doppler parameters. - Aortic valve: There was no regurgitation. - Mitral valve: There was trivial regurgitation. - Right ventricle: The cavity size was severely dilated. Wall thickness was normal. Systolic function was moderately reduced. - Right atrium: The atrium was normal in size. - Tricuspid valve: There was moderate regurgitation. - Pulmonic valve: There was trivial regurgitation. - Pulmonary arteries: Systolic pressure was mildly to moderately increased. PA peak pressure: 41 mm Hg (S). - Inferior vena cava: The vessel was dilated. The respirophasic diameter changes were blunted (<50%), consistent with elevated central venous pressure. - Pericardium, extracardiac: There was no pericardial effusion.  SHe has a thyroid mass as well and this is being followed.  Prior to COVID, She went  to the gym 5x/week and rode a stationary bike.  No problems with that activity.   SInce the last visit,  she has done well.     Past Medical History:  Diagnosis Date  . Arthritis   . Asthma 05/31/2016  . Chest pain   . Glaucoma, right eye 06/07/2012  . Left thyroid nodule   . Obesity (BMI 30.0-34.9) 05/31/2016  . Pain   . 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 Seizure-like activity (HCC)   . Shortness of breath     No past surgical history on file.   Current Outpatient Medications  Medication Sig Dispense Refill  . acetaminophen (TYLENOL) 500 MG tablet Take 1,000 mg by mouth every 6 (six) hours as needed for headache (pain).     Marland Kitchen albuterol (PROVENTIL HFA;VENTOLIN HFA) 108 (90 Base) MCG/ACT inhaler Inhale 2 puffs into the lungs every 6 (six) hours as needed for wheezing or shortness of breath. 1 Inhaler 0  . atorvastatin (LIPITOR) 20 MG tablet Take 1 tablet (20 mg total) by mouth daily. 90 tablet 3  . ibuprofen (ADVIL,MOTRIN) 200 MG tablet Take 200 mg by mouth every 6 (six) hours as needed for headache (pain).    Marland Kitchen latanoprost (XALATAN) 0.005 % ophthalmic solution Place 1 drop into both eyes at bedtime. 2.5 mL 1  . Multiple Vitamin (MULTIVITAMIN WITH MINERALS) TABS tablet Take 1 tablet by mouth at bedtime.     No current facility-administered medications for this visit.     Allergies:   Griseofulvin    Social History:  The patient  reports that she has never smoked. She has never used smokeless tobacco. She reports that she does not drink alcohol or use drugs.   Family History:  The patient's family history includes ADD / ADHD in her son; Heart disease in her father; Hernia in her son; Hypertension in her mother; Kidney disease in her brother; Stroke in her mother.    ROS:  Please see the history of present illness.   Otherwise, review of systems are positive for weight gain.   All other systems are reviewed and negative.    PHYSICAL EXAM: VS:  BP 110/64   Pulse 86   Ht 5\' 5"  (1.651 m)   Wt 212 lb 12.8 oz (96.5 kg)   SpO2 98%   BMI 35.41 kg/m  , BMI Body mass index is  35.41 kg/m. GEN: Well nourished, well developed, in no acute distress  HEENT: normal  Neck: no JVD, carotid bruits, or masses Cardiac: RRR; no murmurs, rubs, or gallops,no edema  Respiratory:  clear to auscultation bilaterally, normal work of breathing GI: soft, nontender, nondistended, + BS MS: no deformity or atrophy  Skin: warm and dry, no rash Neuro:  Strength and sensation are intact Psych: euthymic mood, full affect   EKG:   The ekg ordered today demonstrates NSR, PACs   Recent Labs: 03/06/2019: TSH 0.563   Lipid Panel    Component Value Date/Time   CHOL 184 03/06/2019 1400   TRIG 143 03/06/2019 1400   HDL 38 (L) 03/06/2019 1400   CHOLHDL 4.8 (H) 03/06/2019 1400   CHOLHDL 4.6 04/02/2017 2339   VLDL 13 04/02/2017 2339   LDLCALC 120 (H) 03/06/2019 1400     Other studies Reviewed: Additional studies/ records that were reviewed today with results demonstrating: labs.   ASSESSMENT AND PLAN:  1. Coronary calcification: No angina.  She walks regularly without difficulty.  LDL now 120 in 03/2019. COntinue atorvastatin.  SHe was out of it for 6 months.  We will refill.  Start aspirin 81 mg daily. 2. Anticoagulated: Now off blood thinner.  No cause of PE identified per her report. 3. Prior abnormal echo: RV dysfunction in the setting of a PE.  No signs of RV failure on exam.  4. Prediabetes: A1C 6.0 Will treat lipids more aggressively.    Current medicines are reviewed at length with the patient today.  The patient concerns regarding her medicines were addressed.  The following changes have been made:  No change  Labs/ tests ordered today include:  No orders of the defined types were placed in this encounter.   Recommend 150 minutes/week of aerobic exercise Low fat, low carb, high fiber diet recommended  Disposition:   FU in 1 year   Signed, Larae Grooms, MD  03/08/2019 8:45 AM    [address], [place], [place]  [address]  Phone: [phone]; Fax: [phone]

## 2019-03-07 ENCOUNTER — Other Ambulatory Visit: Payer: Self-pay | Admitting: Family Medicine

## 2019-03-07 ENCOUNTER — Encounter: Payer: Self-pay | Admitting: Family Medicine

## 2019-03-07 LAB — LIPID PANEL
Chol/HDL Ratio: 4.8 ratio — ABNORMAL HIGH (ref 0.0–4.4)
Cholesterol, Total: 184 mg/dL (ref 100–199)
HDL: 38 mg/dL — ABNORMAL LOW (ref >39)
LDL Chol Calc (NIH): 120 mg/dL — ABNORMAL HIGH (ref 0–99)
Triglycerides: 143 mg/dL (ref 0–149)
VLDL Cholesterol Cal: 26 mg/dL (ref 5–40)

## 2019-03-07 LAB — T3, FREE: T3, Free: 3.5 pg/mL (ref 2.0–4.4)

## 2019-03-07 LAB — TSH+FREE T4
Free T4: 1.49 ng/dL (ref 0.82–1.77)
TSH: 0.563 u[IU]/mL (ref 0.450–4.500)

## 2019-03-07 MED ORDER — ATORVASTATIN CALCIUM 20 MG PO TABS: 20.0000 mg | ORAL_TABLET | Freq: Every day | ORAL | 3 refills | Status: DC

## 2019-03-07 NOTE — Progress Notes (Signed)
Patient came in for routine labs. Found to have ASCVD risk of 5.1% Most up to date recommendations indicate she will benefit from a moderate intensity statin. All other labs normal. I have called patient and notified her via VM of her lab results and that I am sending in a new medication for her to take daily. Will also send a letter with results.

## 2019-03-07 NOTE — Progress Notes (Signed)
Called patient and LVM about cholesterol and starting a statin.

## 2019-03-08 ENCOUNTER — Other Ambulatory Visit: Payer: Self-pay

## 2019-03-08 ENCOUNTER — Encounter: Payer: Self-pay | Admitting: Interventional Cardiology

## 2019-03-08 ENCOUNTER — Ambulatory Visit: Payer: Medicare Other | Admitting: Interventional Cardiology

## 2019-03-08 VITALS — BP 110/64 | HR 86 | Ht 65.0 in | Wt 212.8 lb

## 2019-03-08 DIAGNOSIS — R7303 Prediabetes: Secondary | ICD-10-CM

## 2019-03-08 DIAGNOSIS — I2510 Atherosclerotic heart disease of native coronary artery without angina pectoris: Secondary | ICD-10-CM

## 2019-03-08 DIAGNOSIS — I2602 Saddle embolus of pulmonary artery with acute cor pulmonale: Secondary | ICD-10-CM

## 2019-03-08 MED ORDER — ASPIRIN EC 81 MG PO TBEC: 81.0000 mg | DELAYED_RELEASE_TABLET | Freq: Every day | ORAL | 3 refills | Status: DC

## 2019-03-08 MED ORDER — ATORVASTATIN CALCIUM 20 MG PO TABS: 20.0000 mg | ORAL_TABLET | Freq: Every day | ORAL | 3 refills | Status: DC

## 2019-03-08 NOTE — Patient Instructions (Signed)
Medication Instructions:  Your physician has recommended you make the following change in your medication:   START: aspirin 81 mg once a day with food   *If you need a refill on your cardiac medications before your next appointment, please call your pharmacy*  Lab Work: Your physician recommends that you return for a FASTING lipid profile and liver function panel   If you have labs (blood work) drawn today and your tests are completely normal, you will receive your results only by: Marland Kitchen MyChart Message (if you have MyChart) OR . A paper copy in the mail If you have any lab test that is abnormal or we need to change your treatment, we will call you to review the results.  Testing/Procedures: None ordered  Follow-Up: At [facility], you and your health needs are our priority.  As part of our continuing mission to provide you with exceptional heart care, we have created designated Provider Care Teams.  These Care Teams include your primary Cardiologist (physician) and Advanced Practice Providers (APPs -  Physician Assistants and Nurse Practitioners) who all work together to provide you with the care you need, when you need it.  Your next appointment:   12 month(s)  The format for your next appointment:   In Person  Provider:   You may see Larae Grooms, MD or one of the following Advanced Practice Providers on your designated Care Team:    Melina Copa, PA-C  Ermalinda Barrios, PA-C   Other Instructions

## 2019-03-12 ENCOUNTER — Ambulatory Visit (HOSPITAL_COMMUNITY): Payer: Medicare Other

## 2019-04-17 ENCOUNTER — Ambulatory Visit (HOSPITAL_COMMUNITY): Payer: Medicare Other

## 2019-05-26 ENCOUNTER — Ambulatory Visit: Payer: Medicare Other | Attending: Internal Medicine

## 2019-05-26 DIAGNOSIS — Z23 Encounter for immunization: Secondary | ICD-10-CM | POA: Insufficient documentation

## 2019-05-26 NOTE — Progress Notes (Signed)
   Covid-19 Vaccination Clinic  Name:  EMBRY MANRIQUE    MRN: [id number] DOB: 10-18-1955  05/26/2019  Ms. Bermea was observed post Covid-19 immunization for 15 minutes without incidence. She was provided with Vaccine Information Sheet and instruction to access the V-Safe system.   Ms. Dicarlo was instructed to call 911 with any severe reactions post vaccine: Marland Kitchen Difficulty breathing  . Swelling of your face and throat  . A fast heartbeat  . A bad rash all over your body  . Dizziness and weakness    Immunizations Administered    Name Date Dose VIS Date Route   Pfizer COVID-19 Vaccine 05/26/2019  3:25 PM 0.3 mL 03/15/2019 Intramuscular   Manufacturer: [employer], [employer]   Lot: [id number]   NDC: [address]-8

## 2019-06-05 ENCOUNTER — Other Ambulatory Visit: Payer: Self-pay

## 2019-06-05 ENCOUNTER — Other Ambulatory Visit: Payer: Medicare Other | Admitting: *Deleted

## 2019-06-05 DIAGNOSIS — I2510 Atherosclerotic heart disease of native coronary artery without angina pectoris: Secondary | ICD-10-CM

## 2019-06-05 DIAGNOSIS — I2602 Saddle embolus of pulmonary artery with acute cor pulmonale: Secondary | ICD-10-CM

## 2019-06-05 LAB — HEPATIC FUNCTION PANEL
ALT: 18 IU/L (ref 0–32)
AST: 22 IU/L (ref 0–40)
Albumin: 4.4 g/dL (ref 3.8–4.8)
Alkaline Phosphatase: 127 IU/L — ABNORMAL HIGH (ref 39–117)
Bilirubin Total: 0.3 mg/dL (ref 0.0–1.2)
Bilirubin, Direct: 0.1 mg/dL (ref 0.00–0.40)
Total Protein: 6.3 g/dL (ref 6.0–8.5)

## 2019-06-05 LAB — LIPID PANEL
Chol/HDL Ratio: 3.6 ratio (ref 0.0–4.4)
Cholesterol, Total: 125 mg/dL (ref 100–199)
HDL: 35 mg/dL — ABNORMAL LOW (ref >39)
LDL Chol Calc (NIH): 65 mg/dL (ref 0–99)
Triglycerides: 140 mg/dL (ref 0–149)
VLDL Cholesterol Cal: 25 mg/dL (ref 5–40)

## 2019-06-19 ENCOUNTER — Ambulatory Visit: Payer: Medicare Other | Attending: Internal Medicine

## 2019-06-19 DIAGNOSIS — Z23 Encounter for immunization: Secondary | ICD-10-CM

## 2019-06-19 NOTE — Progress Notes (Signed)
   Covid-19 Vaccination Clinic  Name:  Barbara Wells    MRN: [id number] DOB: 1956-02-21  06/19/2019  Barbara Wells was observed post Covid-19 immunization for 15 minutes without incident. She was provided with Vaccine Information Sheet and instruction to access the V-Safe system.   Barbara Wells was instructed to call 911 with any severe reactions post vaccine: Marland Kitchen Difficulty breathing  . Swelling of face and throat  . A fast heartbeat  . A bad rash all over body  . Dizziness and weakness   Immunizations Administered    Name Date Dose VIS Date Route   Pfizer COVID-19 Vaccine 06/19/2019 10:35 AM 0.3 mL 03/15/2019 Intramuscular   Manufacturer: [employer], [employer]   Lot: [id number]   NDC: [address]-7

## 2019-09-17 IMAGING — US US THYROID
1 series · 13 of 25 positions shown · non-contrast
Comparison: CT chest 04/02/2017

CLINICAL DATA: Goiter. Left thyroid nodule seen on CT today.
Emergent evaluation is requested.

EXAM:
THYROID ULTRASOUND
TECHNIQUE: Ultrasound examination of the thyroid gland and adjacent soft
tissues was performed.

[Series 1: us thyroid · 0.08mm/px · 13 of 56 slices shown]
[im 1/56]
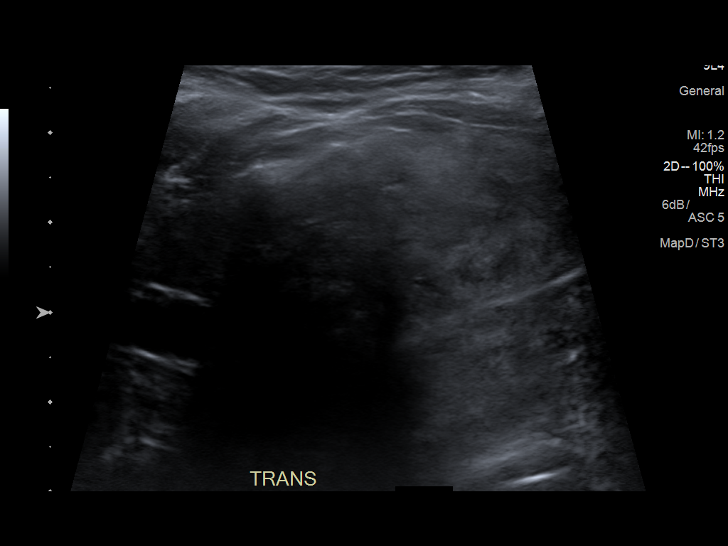
[im 5/56]
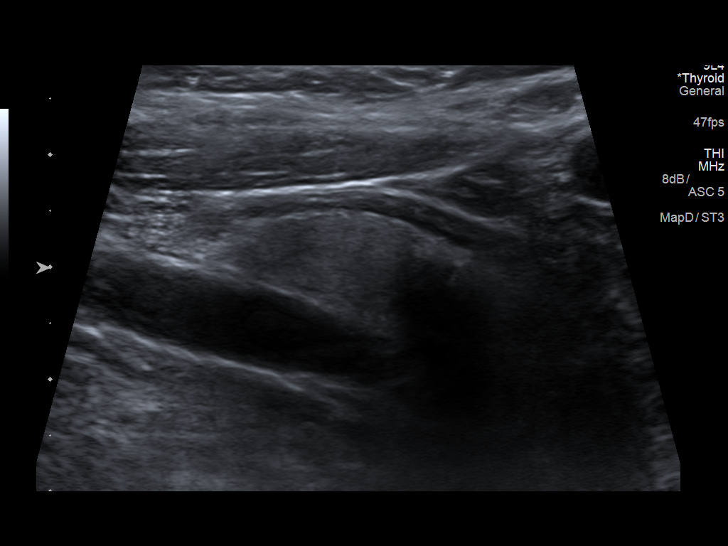
[im 10/56]
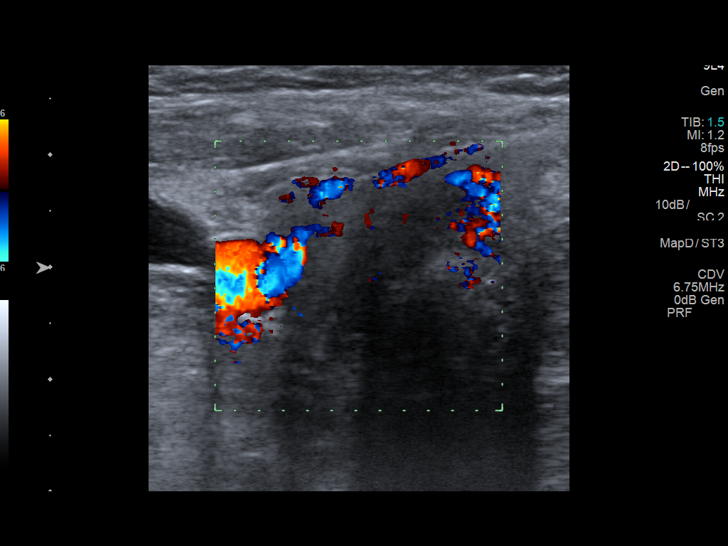
[im 14/56]
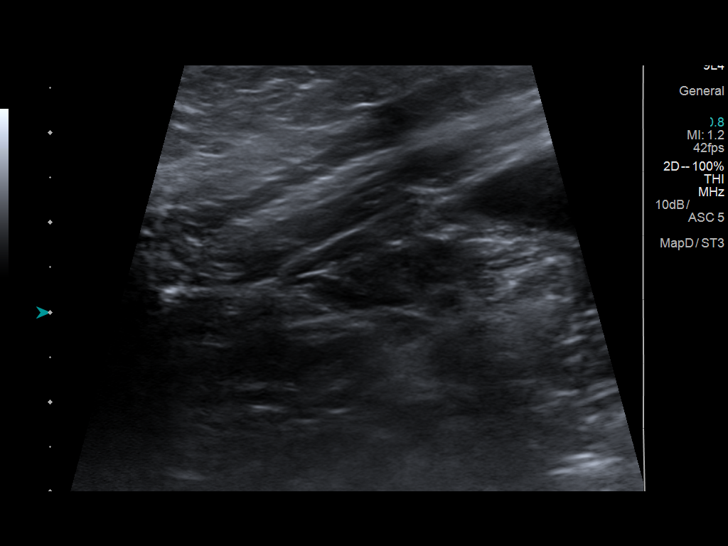
[im 19/56]
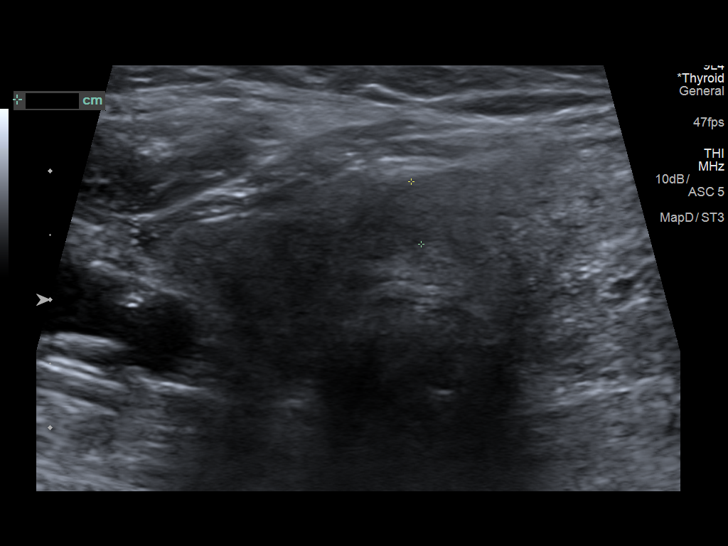
[im 23/56]
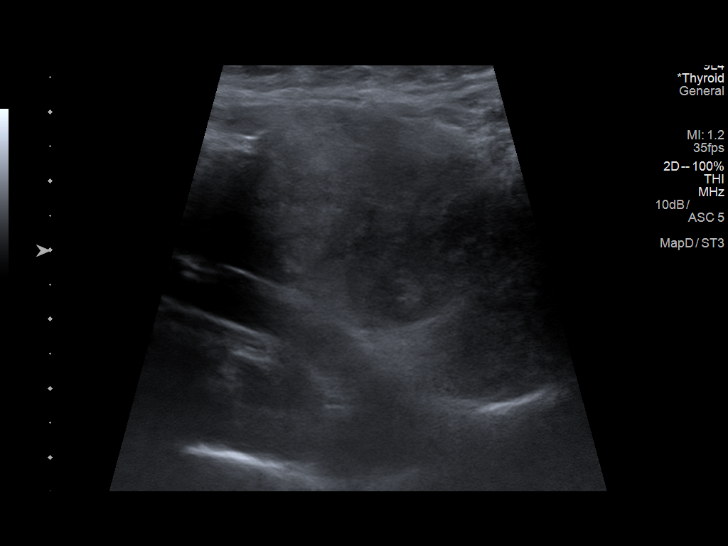
[im 28/56]
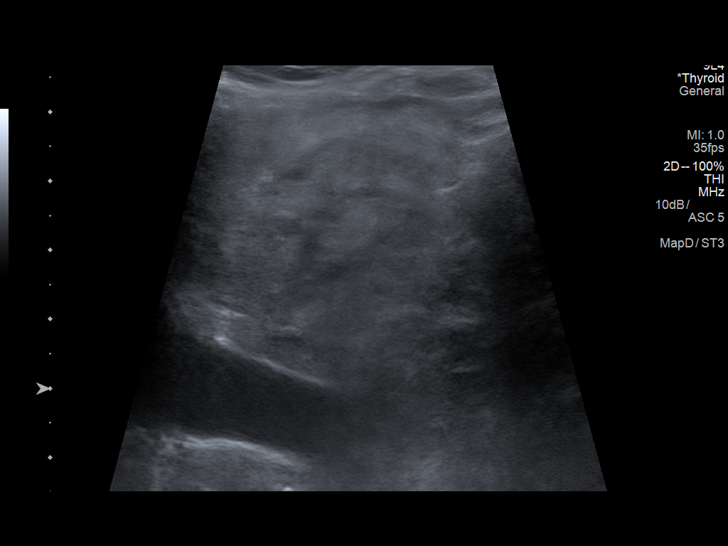
[im 33/56]
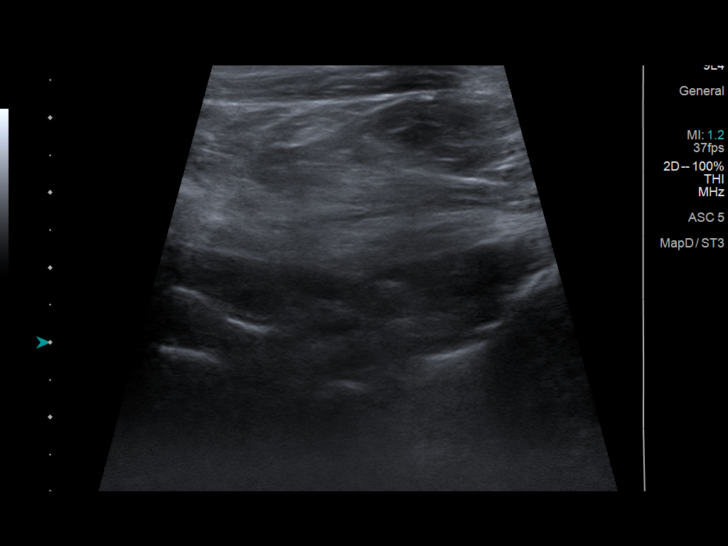
[im 37/56]
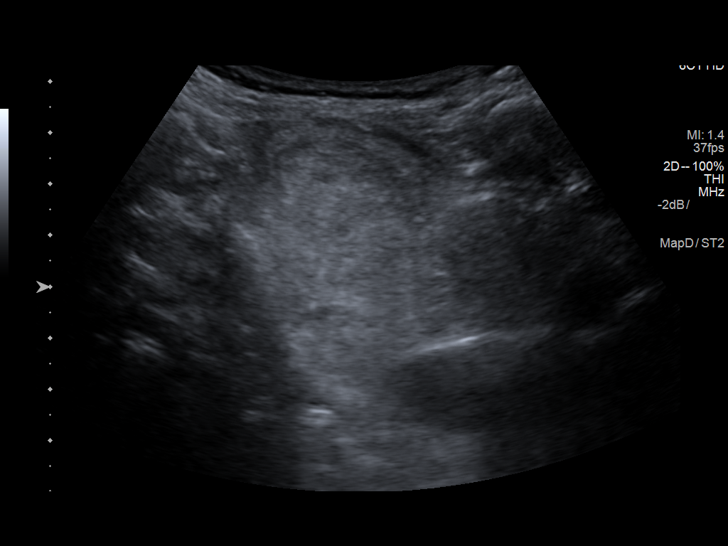
[im 42/56]
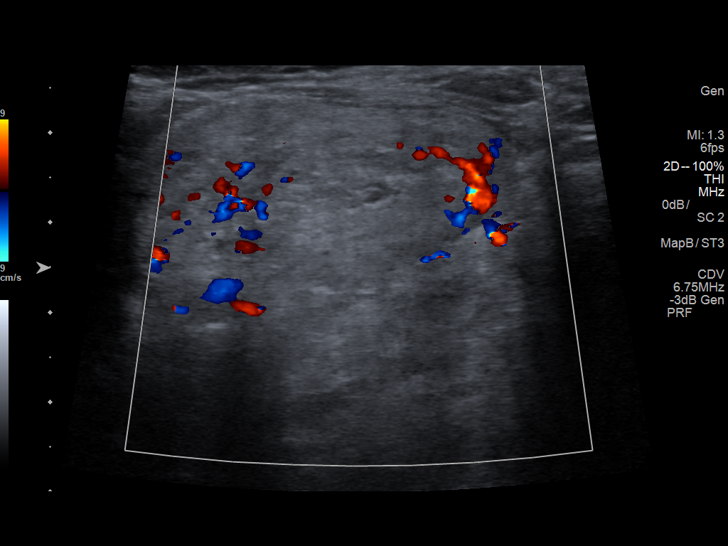
[im 46/56]
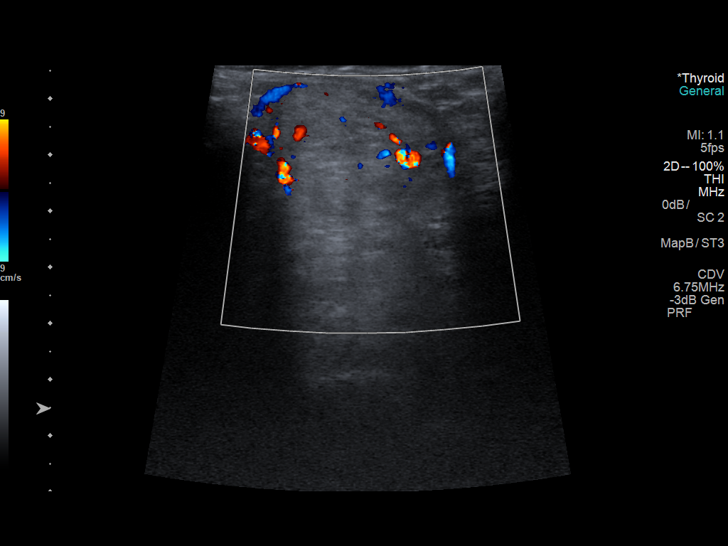
[im 51/56]
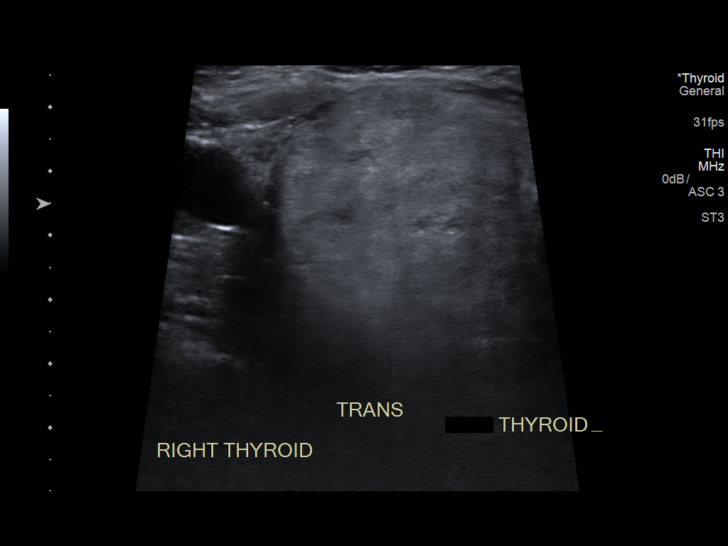
[im 56/56]
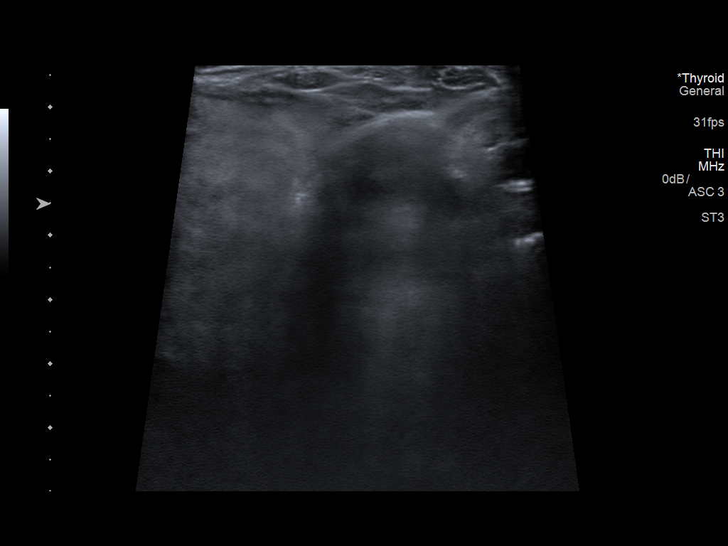

[13 of 25 positions shown; findings below may reference images not displayed]

FINDINGS: Parenchymal Echotexture: Thyroid parenchymal echotexture is
diffusely heterogeneous.

Isthmus: Measures 5 mm thickness.  No focal nodules.

Right lobe: Measures 2.6 x 0.8 x 1.1 cm.  No focal nodules.

Left lobe: Measures 7.4 x 4.8 x 6 cm. Left thyroid is diffusely
enlarged and heterogeneous in appearance. No specific focal dominant
nodules identified. Flow is demonstrated throughout the enlarged
left thyroid gland. No thyroid calcifications are noted.

_________________________________________________________

Estimated total number of nodules >/= 1 cm: 0

Number of spongiform nodules >/=  2 cm not described below (TR1): 0

Number of mixed cystic and solid nodules >/= 1.5 cm not described
below (TR2): 0

_________________________________________________________

No discrete nodules are seen within the thyroid gland.
IMPRESSION: The left thyroid is diffusely enlarged and heterogeneous. Is likely
due to goiter. No focal dominant nodule identified. Due to size,
consider six-month follow-up ultrasound study.

The above is in keeping with the ACR TI-RADS recommendations - [HOSPITAL] 9177;[DATE].

## 2020-05-11 ENCOUNTER — Encounter: Payer: Medicare Other | Admitting: Family Medicine

## 2020-05-11 NOTE — Progress Notes (Deleted)
    SUBJECTIVE:   CHIEF COMPLAINT / HPI:   CAD Follows with Dr. Irish Lack, last seen December 2020 Advised to follow-up in 1 year Lipid panel 06/05/2019, LDL 65 Hepatic function panel obtained at that time as well, alk phos elevated to 127  Prediabetes Last A1c 03/2019, 6.0 ***  Elevated alk phos Last hepatic function panel in March 2021, alk phos 127, previous values have been within normal limits  Colonoscopy*** Mammogram*** DEXA scan*** COVID booster*** Flu shot***  PERTINENT  PMH / PSH: ***  OBJECTIVE:   There were no vitals taken for this visit.  ***  ASSESSMENT/PLAN:   No problem-specific Assessment & Plan notes found for this encounter.     Cleophas Dunker, [place]

## 2020-05-18 ENCOUNTER — Encounter: Payer: Self-pay | Admitting: Family Medicine

## 2020-05-18 ENCOUNTER — Other Ambulatory Visit: Payer: Self-pay

## 2020-05-18 ENCOUNTER — Ambulatory Visit (INDEPENDENT_AMBULATORY_CARE_PROVIDER_SITE_OTHER): Payer: Medicare Other | Admitting: Family Medicine

## 2020-05-18 VITALS — BP 112/60 | HR 74 | Ht 65.0 in | Wt 224.2 lb

## 2020-05-18 DIAGNOSIS — Z13820 Encounter for screening for osteoporosis: Secondary | ICD-10-CM

## 2020-05-18 DIAGNOSIS — D649 Anemia, unspecified: Secondary | ICD-10-CM | POA: Insufficient documentation

## 2020-05-18 DIAGNOSIS — M25561 Pain in right knee: Secondary | ICD-10-CM | POA: Diagnosis not present

## 2020-05-18 DIAGNOSIS — E041 Nontoxic single thyroid nodule: Secondary | ICD-10-CM

## 2020-05-18 DIAGNOSIS — Z1211 Encounter for screening for malignant neoplasm of colon: Secondary | ICD-10-CM

## 2020-05-18 DIAGNOSIS — E785 Hyperlipidemia, unspecified: Secondary | ICD-10-CM | POA: Diagnosis not present

## 2020-05-18 DIAGNOSIS — Z1231 Encounter for screening mammogram for malignant neoplasm of breast: Secondary | ICD-10-CM

## 2020-05-18 MED ORDER — ATORVASTATIN CALCIUM 20 MG PO TABS: 20.0000 mg | ORAL_TABLET | Freq: Every day | ORAL | 3 refills | Status: DC

## 2020-05-18 MED ORDER — ALBUTEROL SULFATE HFA 108 (90 BASE) MCG/ACT IN AERS: 2.0000 | INHALATION_SPRAY | Freq: Four times a day (QID) | RESPIRATORY_TRACT | 0 refills | Status: DC | PRN

## 2020-05-18 NOTE — Assessment & Plan Note (Signed)
No goiter palpated on exam.  Recommended follow-up ultrasound, but patient declines at this time.  States that she does not have any problems, TSH has previously been within normal limits, size was only concern on previous ultrasound, but patient states she would not want to have surgery.  Again reiterated that she should have ultrasound, but she declines.  Will obtain a TSH with her labs today.

## 2020-05-18 NOTE — Progress Notes (Signed)
    SUBJECTIVE:   CHIEF COMPLAINT / HPI:   History of thyroid nodule/goiter Last ultrasound on 04/03/2017 Recommended follow-up 30-month ultrasound given size Last TSH 03/2019, 0.563 No trouble with swallowing, doesn't want surgery Does not appear she has had this completed  Hyperlipidemia Current regimen: Lipitor 20 mg daily Last lipid panel 06/05/2019, LDL 65 at that time  Right Knee Pain Aching x 1 month Never before Tylenol arthritis helps Sometimes takes ibuprofen which was every better Mainly worse with the cold air, felt good when it was warm Sometimes swells Never red, thinks it might get hot Rides a bike everyday that she doesn't work Works as a Teacher, adult education for work, works on weekends only  Pain is all over the front of her knee Had an injury in high school when she was skating and feel on that knee Feels mostly when trying to get out of the chair  Colonoscopy declines, wants to do Cologuard Mammogram many years ago DEXA scan never had COVID booster needed, will get today Flu shot, declines  PERTINENT  PMH / PSH: Hx CAD, Hx PE, Asthma, left thyroid nodule, Glaucoma OD, obesity,   OBJECTIVE:   BP 112/60   Pulse 74   Ht 5\' 5"  (1.651 m)   Wt 224 lb 3.2 oz (101.7 kg)   SpO2 99%   BMI 37.31 kg/m    Physical Exam:  General: 65 y.o. female in NAD Neck: supple, no goiter Cardio: RRR no m/r/g Lungs: CTAB, no wheezing, no rhonchi, no crackles, no IWOB on RA Skin: warm and dry Extremities: No edema  Right Knee: - Inspection: no gross deformity. No swelling/effusion, erythema or bruising. Skin intact b/l - Palpation: no TTP - ROM: full active ROM with flexion and extension in knee and hip - Strength: 5/5 strength b/l - Neuro/vasc: NV intact b/l - Special Tests: - LIGAMENTS: negative anterior and posterior drawer, negative Lachman's, no MCL or LCL laxity b/l -- MENISCUS: negative McMurray's right -- PF JOINT: nml patellar mobility bilaterally  Hips:  normal ROM bilaterally    ASSESSMENT/PLAN:   Hyperlipidemia Continue current regimen.  Obtain lipid panel today.  Left thyroid nodule No goiter palpated on exam.  Recommended follow-up ultrasound, but patient declines at this time.  States that she does not have any problems, TSH has previously been within normal limits, size was only concern on previous ultrasound, but patient states she would not want to have surgery.  Again reiterated that she should have ultrasound, but she declines.  Will obtain a TSH with her labs today.  Normocytic anemia Hemoglobin noted to be slightly decreased at 11.7 on last check.  Will obtain a CBC today.  Acute pain of right knee No obvious abnormal findings on examination today.  Most likely related to osteoarthritis or patellofemoral.  Advised that she can continue to use limited ibuprofen or Tylenol as needed.  Can also use Voltaren gel.  Discussed that if this becomes more frequent, can consider x-rays in could offer steroid injections or even knee replacement in the future if this is needed for severe osteoarthritis.  Offered physical therapy, patient declined.     65, DO [facility]

## 2020-05-18 NOTE — Assessment & Plan Note (Signed)
Hemoglobin noted to be slightly decreased at 11.7 on last check.  Will obtain a CBC today.

## 2020-05-18 NOTE — Assessment & Plan Note (Signed)
Continue current regimen.  Obtain lipid panel today.

## 2020-05-18 NOTE — Patient Instructions (Signed)
Thank you for coming to see me today. It was a pleasure. Today we talked about:   We will get some labs today.  If they are abnormal or we need to do something about them, I will call you.  If they are normal, I will send you a message on MyChart (if it is active) or a letter in the mail.  If you don't hear from [place] in 2 weeks, please call the office at the number below.  I have placed an order for your mammogram and bone density scan.  Please call [facility] at [phone] to schedule your appointment within one week.  You can use voltaren gel as needed for your knee. You can get this over the counter.  Please follow-up with PCP in 6 months.  If you have any questions or concerns, please do not hesitate to call the office at [phone].  Best,   Luis Abed, DO

## 2020-05-18 NOTE — Assessment & Plan Note (Signed)
No obvious abnormal findings on examination today.  Most likely related to osteoarthritis or patellofemoral.  Advised that she can continue to use limited ibuprofen or Tylenol as needed.  Can also use Voltaren gel.  Discussed that if this becomes more frequent, can consider x-rays in could offer steroid injections or even knee replacement in the future if this is needed for severe osteoarthritis.  Offered physical therapy, patient declined.

## 2020-05-19 ENCOUNTER — Other Ambulatory Visit: Payer: Self-pay | Admitting: Family Medicine

## 2020-05-19 DIAGNOSIS — E785 Hyperlipidemia, unspecified: Secondary | ICD-10-CM

## 2020-05-19 LAB — CBC WITH DIFFERENTIAL/PLATELET
Basophils Absolute: 0 10*3/uL (ref 0.0–0.2)
Basos: 0 %
EOS (ABSOLUTE): 0 10*3/uL (ref 0.0–0.4)
Eos: 0 %
Hematocrit: 39.4 % (ref 34.0–46.6)
Hemoglobin: 12.6 g/dL (ref 11.1–15.9)
Immature Grans (Abs): 0 10*3/uL (ref 0.0–0.1)
Immature Granulocytes: 0 %
Lymphocytes Absolute: 1.8 10*3/uL (ref 0.7–3.1)
Lymphs: 26 %
MCH: 25 pg — ABNORMAL LOW (ref 26.6–33.0)
MCHC: 32 g/dL (ref 31.5–35.7)
MCV: 78 fL — ABNORMAL LOW (ref 79–97)
Monocytes Absolute: 0.4 10*3/uL (ref 0.1–0.9)
Monocytes: 6 %
Neutrophils Absolute: 4.8 10*3/uL (ref 1.4–7.0)
Neutrophils: 68 %
Platelets: 204 10*3/uL (ref 150–450)
RBC: 5.03 x10E6/uL (ref 3.77–5.28)
RDW: 14.5 % (ref 11.7–15.4)
WBC: 7.1 10*3/uL (ref 3.4–10.8)

## 2020-05-19 LAB — BASIC METABOLIC PANEL
BUN/Creatinine Ratio: 18 (ref 12–28)
BUN: 12 mg/dL (ref 8–27)
CO2: 19 mmol/L — ABNORMAL LOW (ref 20–29)
Calcium: 9.5 mg/dL (ref 8.7–10.3)
Chloride: 102 mmol/L (ref 96–106)
Creatinine, Ser: 0.66 mg/dL (ref 0.57–1.00)
GFR calc Af Amer: 106 mL/min/{1.73_m2} (ref >59)
GFR calc non Af Amer: 92 mL/min/{1.73_m2} (ref >59)
Glucose: 92 mg/dL (ref 65–99)
Potassium: 4 mmol/L (ref 3.5–5.2)
Sodium: 139 mmol/L (ref 134–144)

## 2020-05-19 LAB — LIPID PANEL
Chol/HDL Ratio: 4.7 ratio — ABNORMAL HIGH (ref 0.0–4.4)
Cholesterol, Total: 178 mg/dL (ref 100–199)
HDL: 38 mg/dL — ABNORMAL LOW (ref >39)
LDL Chol Calc (NIH): 121 mg/dL — ABNORMAL HIGH (ref 0–99)
Triglycerides: 105 mg/dL (ref 0–149)
VLDL Cholesterol Cal: 19 mg/dL (ref 5–40)

## 2020-05-19 LAB — TSH: TSH: 0.573 u[IU]/mL (ref 0.450–4.500)

## 2020-05-19 MED ORDER — ATORVASTATIN CALCIUM 40 MG PO TABS: 40.0000 mg | ORAL_TABLET | Freq: Every day | ORAL | 3 refills | Status: DC

## 2020-05-19 NOTE — Progress Notes (Signed)
See result note.  

## 2020-05-21 ENCOUNTER — Ambulatory Visit: Payer: Medicare Other

## 2020-05-22 ENCOUNTER — Ambulatory Visit (INDEPENDENT_AMBULATORY_CARE_PROVIDER_SITE_OTHER): Payer: Medicare Other

## 2020-05-22 ENCOUNTER — Other Ambulatory Visit: Payer: Self-pay

## 2020-05-22 DIAGNOSIS — Z23 Encounter for immunization: Secondary | ICD-10-CM | POA: Diagnosis not present

## 2020-05-22 NOTE — Progress Notes (Signed)
   Covid-19 Vaccination Clinic  Name:  Barbara Wells    MRN: [id number] DOB: 1955-07-16  05/22/2020   Patient presents to nurse clinic for booster COVID vaccine. Administered in RD, site unremarkable, tolerated injection well.   [name] was observed post Covid-19 immunization for 15 minutes without incident. She was provided with Vaccine Information Sheet and instruction to access the V-Safe system.   [name] was instructed to call 911 with any severe reactions post vaccine: Marland Kitchen Difficulty breathing  . Swelling of face and throat  . A fast heartbeat  . A bad rash all over body  . Dizziness and weakness   Immunizations Administered    Name Date Dose VIS Date Route   PFIZER Comrnaty(Gray TOP) Covid-19 Vaccine 05/22/2020 10:15 AM 0.3 mL 03/12/2020 Intramuscular   Manufacturer: [employer], [employer]   Lot: [id number]   NDC: [id number]     Provided patient with updated immunization card.   Veronda Prude, RN

## 2020-05-22 NOTE — Addendum Note (Signed)
Addended by: Veronda Prude on: 05/22/2020 03:10 PM   Modules accepted: Level of Service

## 2020-08-16 NOTE — Progress Notes (Signed)
Cardiology Office Note   Date:  08/17/2020   ID:  Barbara Wells, DOB Oct 17, 1955, MRN [id number]  PCP:  Unknown Jim, DO    No chief complaint on file.  Coronary calcfication  Wt Readings from Last 3 Encounters:  08/17/20 222 lb 3.2 oz (100.8 kg)  05/18/20 224 lb 3.2 oz (101.7 kg)  03/08/19 212 lb 12.8 oz (96.5 kg)       History of Present Illness: Barbara Wells is a 65 y.o. female   had a submassive PE in 12/18. She presented with syncope. She was given a SL NTG. She had right heart strain. She took anticoagulation for about a year.No cause of PE documented.  She was seen for coronary artery calcification.  2018 echo showed: Left ventricle: The cavity size was normal. Systolic function was normal. The estimated ejection fraction was in the range of 55% to 60%. Wall motion was normal; there were no regional wall motion abnormalities. Doppler parameters are consistent with abnormal left ventricular relaxation (grade 1 diastolic dysfunction). There was no evidence of elevated ventricular filling pressure by Doppler parameters. - Aortic valve: There was no regurgitation. - Mitral valve: There was trivial regurgitation. - Right ventricle: The cavity size was severely dilated. Wall thickness was normal. Systolic function was moderately reduced. - Right atrium: The atrium was normal in size. - Tricuspid valve: There was moderate regurgitation. - Pulmonic valve: There was trivial regurgitation. - Pulmonary arteries: Systolic pressure was mildly to moderately increased. PA peak pressure: 41 mm Hg (S). - Inferior vena cava: The vessel was dilated. The respirophasic diameter changes were blunted (<50%), consistent with elevated central venous pressure. - Pericardium, extracardiac: There was no pericardial effusion.  SHe has a thyroid mass as well and this is being followed.  THis was stable in 2021.  Denies : Chest pain.  Dizziness. Leg edema. Nitroglycerin use. Orthopnea. Palpitations. Paroxysmal nocturnal dyspnea. Shortness of breath. Syncope.   No bleeding issues.  She reports cramps and cold feet on occasion.   She cycles 4x/week for 30-60 minutes.   Past Medical History:  Diagnosis Date  . Arthritis   . Asthma 05/31/2016  . Chest pain   . Glaucoma, right eye 06/07/2012  . Left thyroid nodule   . Obesity (BMI 30.0-34.9) 05/31/2016  . Pain   . 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 Seizure-like activity (HCC)   . Shortness of breath     No past surgical history on file.   Current Outpatient Medications  Medication Sig Dispense Refill  . acetaminophen (TYLENOL) 500 MG tablet Take 1,000 mg by mouth every 6 (six) hours as needed for headache (pain).     Marland Kitchen albuterol (VENTOLIN HFA) 108 (90 Base) MCG/ACT inhaler Inhale 2 puffs into the lungs every 6 (six) hours as needed for wheezing or shortness of breath. 1 each 0  . aspirin EC 81 MG tablet Take 1 tablet (81 mg total) by mouth daily. 90 tablet 3  . atorvastatin (LIPITOR) 40 MG tablet Take 1 tablet (40 mg total) by mouth daily. 90 tablet 3  . ibuprofen (ADVIL,MOTRIN) 200 MG tablet Take 200 mg by mouth every 6 (six) hours as needed for headache (pain).    Marland Kitchen latanoprost (XALATAN) 0.005 % ophthalmic solution Place 1 drop into both eyes at bedtime. 2.5 mL 1  . Multiple Vitamin (MULTIVITAMIN WITH MINERALS) TABS tablet Take 1 tablet by mouth at bedtime.     No current facility-administered medications for this visit.  Allergies:   Griseofulvin    Social History:  The patient  reports that she has never smoked. She has never used smokeless tobacco. She reports that she does not drink alcohol and does not use drugs.   Family History:  The patient's family history includes ADD / ADHD in her son; Heart disease in her father; Hernia in her son; Hypertension in her mother; Kidney disease in her brother; Stroke in her mother.    ROS:  Please see the  history of present illness.   Otherwise, review of systems are positive for leg cramps.   All other systems are reviewed and negative.    PHYSICAL EXAM: VS:  BP 104/62   Pulse 95   Ht 5\' 5"  (1.651 m)   Wt 222 lb 3.2 oz (100.8 kg)   SpO2 98%   BMI 36.98 kg/m  , BMI Body mass index is 36.98 kg/m. GEN: Well nourished, well developed, in no acute distress  HEENT: right eye difference  Neck: no JVD, carotid bruits, or masses Cardiac: RRR; no murmurs, rubs, or gallops,no edema  Respiratory:  clear to auscultation bilaterally, normal work of breathing GI: soft, nontender, nondistended, + BS MS: no deformity or atrophy , 2+ DP pulses bilaterally Skin: warm and dry, no rash Neuro:  Strength and sensation are intact Psych: euthymic mood, full affect   EKG:   The ekg ordered today demonstrates NSR, no ST segment changes   Recent Labs: 05/18/2020: BUN 12; Creatinine, Ser 0.66; Hemoglobin 12.6; Platelets 204; Potassium 4.0; Sodium 139; TSH 0.573   Lipid Panel    Component Value Date/Time   CHOL 178 05/18/2020 1027   TRIG 105 05/18/2020 1027   HDL 38 (L) 05/18/2020 1027   CHOLHDL 4.7 (H) 05/18/2020 1027   CHOLHDL 4.6 04/02/2017 2339   VLDL 13 04/02/2017 2339   LDLCALC 121 (H) 05/18/2020 1027     Other studies Reviewed: Additional studies/ records that were reviewed today with results demonstrating: LDL 121 in 2/22.   ASSESSMENT AND PLAN:  1. Coronary calcification: No angina. Walks a lot at work.  10-12K steps when she works on the weekends. Continue atorvastatin.  Continue aspirin given prior PE and coronary calcifications.  2. Prior abnormal echo: No evidence of fluid overload.  3. Prediabetes: High fiber diet. Whole food, plant based diet. Eats a lot of nuts.   4. Leg cramps: stay well hydrated.  Potassium 4.0 in 2/22.  Continue regular walking.    Current medicines are reviewed at length with the patient today.  The patient concerns regarding her medicines were  addressed.  The following changes have been made:  No change  Labs/ tests ordered today include:  No orders of the defined types were placed in this encounter.   Recommend 150 minutes/week of aerobic exercise Low fat, low carb, high fiber diet recommended  Disposition:   FU in 1 year   Signed, 3/22, MD  08/17/2020 10:24 AM    [facility] [address] [place], [place], [place]  [place] Phone: [phone]; Fax: [phone]

## 2020-08-17 ENCOUNTER — Ambulatory Visit: Payer: Medicare Other | Admitting: Interventional Cardiology

## 2020-08-17 ENCOUNTER — Encounter: Payer: Self-pay | Admitting: Interventional Cardiology

## 2020-08-17 ENCOUNTER — Other Ambulatory Visit: Payer: Self-pay

## 2020-08-17 VITALS — BP 104/62 | HR 95 | Ht 65.0 in | Wt 222.2 lb

## 2020-08-17 DIAGNOSIS — R7303 Prediabetes: Secondary | ICD-10-CM | POA: Diagnosis not present

## 2020-08-17 DIAGNOSIS — I2602 Saddle embolus of pulmonary artery with acute cor pulmonale: Secondary | ICD-10-CM | POA: Diagnosis not present

## 2020-08-17 DIAGNOSIS — I2510 Atherosclerotic heart disease of native coronary artery without angina pectoris: Secondary | ICD-10-CM | POA: Diagnosis not present

## 2020-08-17 NOTE — Patient Instructions (Signed)
Medication Instructions:  Your physician recommends that you continue on your current medications as directed. Please refer to the Current Medication list given to you today.  *If you need a refill on your cardiac medications before your next appointment, please call your pharmacy*   Lab Work: none If you have labs (blood work) drawn today and your tests are completely normal, you will receive your results only by: Marland Kitchen MyChart Message (if you have MyChart) OR . A paper copy in the mail If you have any lab test that is abnormal or we need to change your treatment, we will call you to review the results.   Testing/Procedures: none   Follow-Up: At [facility], you and your health needs are our priority.  As part of our continuing mission to provide you with exceptional heart care, we have created designated Provider Care Teams.  These Care Teams include your primary Cardiologist (physician) and Advanced Practice Providers (APPs -  Physician Assistants and Nurse Practitioners) who all work together to provide you with the care you need, when you need it.  We recommend signing up for the patient portal called "MyChart".  Sign up information is provided on this After Visit Summary.  MyChart is used to connect with patients for Virtual Visits (Telemedicine).  Patients are able to view lab/test results, encounter notes, upcoming appointments, etc.  Non-urgent messages can be sent to your provider as well.   To learn more about what you can do with MyChart, go to [email or web address].    Your next appointment:   12 month(s)  The format for your next appointment:   In Person  Provider:   You may see Lance Muss, MD or one of the following Advanced Practice Providers on your designated Care Team:    Ronie Spies, PA-C  Jacolyn Reedy, PA-C    Other Instructions Make sure to stay well hydrated

## 2020-10-07 ENCOUNTER — Ambulatory Visit: Payer: Medicare Other | Admitting: Interventional Cardiology

## 2020-12-15 ENCOUNTER — Encounter (INDEPENDENT_AMBULATORY_CARE_PROVIDER_SITE_OTHER): Payer: Self-pay

## 2020-12-22 ENCOUNTER — Other Ambulatory Visit: Payer: Self-pay

## 2020-12-22 ENCOUNTER — Ambulatory Visit (INDEPENDENT_AMBULATORY_CARE_PROVIDER_SITE_OTHER): Payer: Medicare Other | Admitting: Ophthalmology

## 2020-12-22 ENCOUNTER — Encounter (INDEPENDENT_AMBULATORY_CARE_PROVIDER_SITE_OTHER): Payer: Self-pay | Admitting: Ophthalmology

## 2020-12-22 DIAGNOSIS — H35352 Cystoid macular degeneration, left eye: Secondary | ICD-10-CM | POA: Insufficient documentation

## 2020-12-22 DIAGNOSIS — H3322 Serous retinal detachment, left eye: Secondary | ICD-10-CM | POA: Insufficient documentation

## 2020-12-22 DIAGNOSIS — H353221 Exudative age-related macular degeneration, left eye, with active choroidal neovascularization: Secondary | ICD-10-CM | POA: Diagnosis not present

## 2020-12-22 NOTE — Assessment & Plan Note (Signed)
Loss of vision absolute glaucoma

## 2020-12-22 NOTE — Assessment & Plan Note (Signed)
Severe component of CNVM overlying CME present

## 2020-12-22 NOTE — Assessment & Plan Note (Signed)
This condition results from chronicity of subfoveal large polypoidal lesion, should resolve and likely resolve over time with antivegF therapy

## 2020-12-22 NOTE — Progress Notes (Addendum)
12/22/2020     CHIEF COMPLAINT Patient presents for  Chief Complaint  Patient presents with   Retina Evaluation      HISTORY OF PRESENT ILLNESS: Barbara Wells is a 65 y.o. female who presents to the clinic today for:   HPI     Retina Evaluation   In left eye.  This started 1 week ago.  Duration of weeks.  Associated Symptoms Negative for Flashes, Floaters and Photophobia.  Context:  distance vision and near vision.  Treatments tried include eye drops.  Response to treatment was moderate improvement.        Comments   NP- severe wet amd OS- referred by Dr. Dione Booze. Pt saw Dr Hortense Ramal 12/15/2020. Pt is here for retina eval for Neovascular AMD with active CNV OS per Dr. Lucious Groves office note. Pt saw Dr Luciana Axe ~ yrs ago and stopped continuation of treatment, patient states "he wanted me to pay $50 a month for treatment and I did not have it then, now I am in a better situation where I can come in." Pt uses latanoprost qhs OS , dorz- tim bid OS.      Last edited by Edmon Crape, MD on 12/22/2020  9:57 AM.      Referring physician: Littie Deeds, MD [address] [place],  [place] [address]  HISTORICAL INFORMATION:   Selected notes from the MEDICAL RECORD NUMBER    Lab Results  Component Value Date   HGBA1C 6.0 03/06/2019     CURRENT MEDICATIONS: Current Outpatient Medications (Ophthalmic Drugs)  Medication Sig   latanoprost (XALATAN) 0.005 % ophthalmic solution Place 1 drop into both eyes at bedtime.   No current facility-administered medications for this visit. (Ophthalmic Drugs)   Current Outpatient Medications (Other)  Medication Sig   acetaminophen (TYLENOL) 500 MG tablet Take 1,000 mg by mouth every 6 (six) hours as needed for headache (pain).    albuterol (VENTOLIN HFA) 108 (90 Base) MCG/ACT inhaler Inhale 2 puffs into the lungs every 6 (six) hours as needed for wheezing or shortness of breath.   aspirin EC 81 MG tablet Take 1 tablet (81 mg total) by mouth  daily.   atorvastatin (LIPITOR) 40 MG tablet Take 1 tablet (40 mg total) by mouth daily.   ibuprofen (ADVIL,MOTRIN) 200 MG tablet Take 200 mg by mouth every 6 (six) hours as needed for headache (pain).   Multiple Vitamin (MULTIVITAMIN WITH MINERALS) TABS tablet Take 1 tablet by mouth at bedtime.   No current facility-administered medications for this visit. 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 No pertinent surgical history.  FAMILY HISTORY Family History  Problem Relation Age of Onset   Stroke Mother    Hypertension Mother    Heart disease Father    Kidney disease Brother    Hernia Son    ADD / ADHD Son     SOCIAL HISTORY Social History   Tobacco Use   Smoking status: Never   Smokeless tobacco: Never  Substance  Use Topics   Alcohol use: No   Drug use: No         OPHTHALMIC EXAM:  Base Eye Exam     Visual Acuity (ETDRS)       Right Left   Dist cc LP 20/200   Dist ph cc  NI         Tonometry (Tonopen, 9:36 AM)       Right Left   Pressure 21 14         Pupils       Dark Shape APD   Right Poor view Irregular None   Left   None         Visual Fields (Counting fingers)       Left Right   Restrictions Total inferior temporal, inferior nasal deficiencies; Partial outer superior temporal deficiency Total superior temporal, inferior temporal, superior nasal, inferior nasal deficiencies         Extraocular Movement       Right Left    LP     -- -- --  --  --  -- -- --   -- -- --  --  --  -- -- --           Neuro/Psych     Oriented x3: Yes    Mood/Affect: Normal         Dilation     Left eye: 1.0% Mydriacyl, 2.5% Phenylephrine @ 9:36 AM           Slit Lamp and Fundus Exam     External Exam       Right Left   External Normal Normal         Slit Lamp Exam       Right Left   Lids/Lashes Normal Normal   Conjunctiva/Sclera White and quiet White and quiet   Cornea Clear Clear   Anterior Chamber Deep and quiet Deep and quiet   Iris Round and reactive Round and reactive   Lens Clear Clear   Anterior Vitreous Normal Normal         Fundus Exam       Right Left   Posterior Vitreous Normal Normal   Disc  Normal   C/D Ratio  0.55   Macula  Subretinal neovascular membrane, with subretinal hemorrhage looks like polypoidal with nodular growths   Vessels  Normal   Periphery  Inferior apparent exudative RD, chronic with exudates in the retina but also subretinal fluid.  No retinal breaks noted            IMAGING AND PROCEDURES  Imaging and Procedures for 02/02/21  OCT, Retina - OS - Left Eye       Quality was good. Scan locations included subfoveal. Central Foveal Thickness: 855. Findings include abnormal foveal contour, choroidal neovascular membrane, cystoid macular edema.   Notes Massive subretinal fluid as well as intraretinal fluid and CME.  Chronic CNVM untreated.     Color Fundus Photography Optos - OS - Left Eye       Progression has no prior data. Disc findings include normal observations. Macula : exudates, hemorrhage. Periphery : exudates.   Notes Intraretinal exudates inferiorly overlying chronic exudative retinal detachment.  This appears clearly be emanating from a large polypoidal choroidal neovascular membrane in the macular region with haptic which has some chronicity.     Intravitreal Injection, Pharmacologic Agent - OS - Left Eye       Time Out 12/22/2020. 10:20 AM. Confirmed correct patient,  procedure, site, and patient consented.   Anesthesia Topical anesthesia was  used. Anesthetic medications included Akten 3.5%.   Procedure Preparation included 5% betadine to ocular surface, 10% betadine to eyelids, Ofloxacin , Tobramycin 0.3%. A 30 gauge needle was used.   Injection: 2.5 mg bevacizumab 2.5 MG/0.1ML   Route: Intravitreal, Site: Left Eye   NDC: 2027392893   Post-op Post injection exam found visual acuity of at least counting fingers. The patient tolerated the procedure well. There were no complications. The patient received written and verbal post procedure care education. Post injection medications included ocuflox.   Notes This procedure note was added on 02-02-2021 to document the prior placement and treatment of wet AMD on 12-22-2020             ASSESSMENT/PLAN:  Exudative age-related macular degeneration of left eye with active choroidal neovascularization (HCC) Subfoveal large CNVM nodular hemorrhage component appears to be polypoidal likely is polypoidal given the exudative retinal detachment inferiorly from the chronicity of lack of follow-up in the past.  Long explanation offering the patient antivegF to halt progression of disease to maintain current visual functioning and give her the only chance of potential improvement.  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 Exudative age-related macular degeneration of left eye with active choroidal neovascularization (HCC)  H35.3221 OCT, Retina - OS - Left Eye    Color Fundus Photography Optos - OS - Left Eye    Intravitreal Injection, Pharmacologic Agent - OS - Left Eye    bevacizumab (AVASTIN) SOSY 2.5 mg    2. Cystoid macular edema of left eye  H35.352 OCT, Retina - OS - Left Eye     Color Fundus Photography Optos - OS - Left Eye    3. Exudative retinal detachment of left eye  H33.22 OCT, Retina - OS - Left Eye    Color Fundus Photography Optos - OS - Left Eye      1.  Large subfoveal CNVM with polypoidal characteristics.  We will commence therapy urgently today with antivegF, Avastin  2.  Plan on use of intravitreal Eylea OS next if she qualifies for CDF and/or Eylea for you as appropriate  3.  We will asked patient to apply for CDF and/or Eylea for you for co-pay assistance as we switch to [facility] which has often a better effect and impact on polypoidal lesions  Ophthalmic Meds Ordered this visit:  Meds ordered this encounter  Medications   bevacizumab (AVASTIN) SOSY 2.5 mg       Return in about 5 weeks (around 01/26/2021) for dilate, OS, EYLEA OCT.  There are no Patient Instructions on file for this visit.   Explained the diagnoses, plan, and follow up with the patient and they expressed understanding.  Patient expressed understanding of the importance of proper follow up care.   Alford Highland Yuli Lanigan M.D. Diseases & Surgery of the Retina and Vitreous [facility] 02/02/21     Abbreviations: M myopia (nearsighted); A astigmatism; H hyperopia (farsighted); P presbyopia; Mrx spectacle prescription;  CTL contact lenses; OD right eye; OS left eye; OU both eyes  XT exotropia; ET esotropia; PEK punctate epithelial keratitis; PEE punctate epithelial erosions; DES dry eye  syndrome; MGD meibomian gland dysfunction; ATs artificial tears; PFAT's preservative free artificial tears; NSC nuclear sclerotic cataract; PSC posterior subcapsular cataract; ERM epi-retinal membrane; PVD posterior vitreous detachment; RD retinal detachment; DM diabetes mellitus; DR diabetic retinopathy; NPDR non-proliferative diabetic retinopathy; PDR proliferative diabetic retinopathy; CSME clinically significant macular edema; DME diabetic macular edema; dbh dot blot hemorrhages; CWS  cotton wool spot; POAG primary open angle glaucoma; C/D cup-to-disc ratio; HVF humphrey visual field; GVF goldmann visual field; OCT optical coherence tomography; IOP intraocular pressure; BRVO Branch retinal vein occlusion; CRVO central retinal vein occlusion; CRAO central retinal artery occlusion; BRAO branch retinal artery occlusion; RT retinal tear; SB scleral buckle; PPV pars plana vitrectomy; VH Vitreous hemorrhage; PRP panretinal laser photocoagulation; IVK intravitreal kenalog; VMT vitreomacular traction; MH Macular hole;  NVD neovascularization of the disc; NVE neovascularization elsewhere; AREDS age related eye disease study; ARMD age related macular degeneration; POAG primary open angle glaucoma; EBMD epithelial/anterior basement membrane dystrophy; ACIOL anterior chamber intraocular lens; IOL intraocular lens; PCIOL posterior chamber intraocular lens; Phaco/IOL phacoemulsification with intraocular lens placement; PRK photorefractive keratectomy; LASIK laser assisted in situ keratomileusis; HTN hypertension; DM diabetes mellitus; COPD chronic obstructive pulmonary disease

## 2020-12-22 NOTE — Assessment & Plan Note (Signed)
Subfoveal large CNVM nodular hemorrhage component appears to be polypoidal likely is polypoidal given the exudative retinal detachment inferiorly from the chronicity of lack of follow-up in the past.  Long explanation offering the patient antivegF to halt progression of disease to maintain current visual functioning and give her the only chance of potential improvement.

## 2021-01-26 ENCOUNTER — Ambulatory Visit (INDEPENDENT_AMBULATORY_CARE_PROVIDER_SITE_OTHER): Payer: Medicare Other | Admitting: Ophthalmology

## 2021-01-26 ENCOUNTER — Other Ambulatory Visit: Payer: Self-pay

## 2021-01-26 ENCOUNTER — Encounter (INDEPENDENT_AMBULATORY_CARE_PROVIDER_SITE_OTHER): Payer: Self-pay | Admitting: Ophthalmology

## 2021-01-26 DIAGNOSIS — H318 Other specified disorders of choroid: Secondary | ICD-10-CM

## 2021-01-26 DIAGNOSIS — H353221 Exudative age-related macular degeneration, left eye, with active choroidal neovascularization: Secondary | ICD-10-CM | POA: Diagnosis not present

## 2021-01-26 MED ORDER — BEVACIZUMAB 2.5 MG/0.1ML IZ SOSY
2.5000 mg | PREFILLED_SYRINGE | INTRAVITREAL | Status: AC | PRN
  Administered 2021-01-26: 2.5 mg via INTRAVITREAL

## 2021-01-26 NOTE — Assessment & Plan Note (Signed)
Slight improvement post injection #1 Avastin OS.  We will repeat injection today.  Insurance denied the use of Eylea which I deemed important because of the likely possibility that this is polypoidal and this monocular vision

## 2021-01-26 NOTE — Assessment & Plan Note (Signed)
OS presents in a monocular patient.  This reason I recommended use of intravitreal Eylea, insurance denied

## 2021-01-26 NOTE — Progress Notes (Signed)
01/26/2021     CHIEF COMPLAINT Patient presents for  Chief Complaint  Patient presents with   Retina Follow Up      HISTORY OF PRESENT ILLNESS: Barbara Wells is a 65 y.o. female who presents to the clinic today for:   HPI     Retina Follow Up   Patient presents with  Wet AMD.  In left eye.  This started 5 weeks ago.  Severity is mild.  Duration of 5 weeks.  Since onset it is stable.        Comments   5 week fu OS oct avastin OS (Eylea not approved until after 3 injections of avastin.) Patient states vision is stable and unchanged since last visit. Denies any new floaters or FOL.  Latanoprost qhs ou.       Last edited by Nelva Nay on 01/26/2021 10:31 AM.      Referring physician: Littie Deeds, MD [address] [place],  [place] [address]  HISTORICAL INFORMATION:   Selected notes from the MEDICAL RECORD NUMBER    Lab Results  Component Value Date   HGBA1C 6.0 03/06/2019     CURRENT MEDICATIONS: Current Outpatient Medications (Ophthalmic Drugs)  Medication Sig   latanoprost (XALATAN) 0.005 % ophthalmic solution Place 1 drop into both eyes at bedtime.   No current facility-administered medications for this visit. (Ophthalmic Drugs)   Current Outpatient Medications (Other)  Medication Sig   acetaminophen (TYLENOL) 500 MG tablet Take 1,000 mg by mouth every 6 (six) hours as needed for headache (pain).    albuterol (VENTOLIN HFA) 108 (90 Base) MCG/ACT inhaler Inhale 2 puffs into the lungs every 6 (six) hours as needed for wheezing or shortness of breath.   aspirin EC 81 MG tablet Take 1 tablet (81 mg total) by mouth daily.   atorvastatin (LIPITOR) 40 MG tablet Take 1 tablet (40 mg total) by mouth daily.   ibuprofen (ADVIL,MOTRIN) 200 MG tablet Take 200 mg by mouth every 6 (six) hours as needed for headache (pain).   Multiple Vitamin (MULTIVITAMIN WITH MINERALS) TABS tablet Take 1 tablet by mouth at bedtime.   No current facility-administered  medications for this visit. 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 No pertinent surgical history.  FAMILY HISTORY Family History  Problem Relation Age of Onset   Stroke Mother    Hypertension Mother    Heart disease Father    Kidney disease Brother    Hernia Son    ADD / ADHD Son     SOCIAL HISTORY Social History   Tobacco Use   Smoking status: Never   Smokeless tobacco: Never  Substance Use Topics   Alcohol use: No   Drug use: No         OPHTHALMIC EXAM:  Base Eye Exam     Visual Acuity (ETDRS)       Right Left   Dist cc LP 20/400   Dist ph cc  NI  Tonometry (Tonopen, 10:32 AM)       Right Left   Pressure 45 18         Pupils       Pupils Dark Light Shape React APD   Right PERRL No view  Irregular  None   Left PERRL 4 3 Round Brisk None         Extraocular Movement       Right Left    Full Full         Neuro/Psych     Oriented x3: Yes   Mood/Affect: Normal         Dilation     Left eye: 1.0% Mydriacyl, 2.5% Phenylephrine @ 10:32 AM           Slit Lamp and Fundus Exam     External Exam       Right Left   External Normal Normal         Slit Lamp Exam       Right Left   Lids/Lashes Normal Normal   Conjunctiva/Sclera White and quiet White and quiet   Cornea Clear Clear   Anterior Chamber Deep and quiet Deep and quiet   Iris Round and reactive Round and reactive   Lens Clear Clear   Anterior Vitreous Normal Normal         Fundus Exam       Right Left   Posterior Vitreous Normal Normal   Disc  Normal    C/D Ratio  0.55   Macula  Subretinal neovascular membrane, with subretinal hemorrhage looks like polypoidal with nodular growths   Vessels  Normal   Periphery  Inferior apparent exudative RD, chronic with exudates in the retina but also subretinal fluid slightly less extensive.  No retinal breaks noted            IMAGING AND PROCEDURES  Imaging and Procedures for 01/26/21  Intravitreal Injection, Pharmacologic Agent - OS - Left Eye       Time Out 01/26/2021. 11:08 AM. Confirmed correct patient, procedure, site, and patient consented.   Anesthesia Topical anesthesia was used. Anesthetic medications included Lidocaine 4%.   Procedure Preparation included 5% betadine to ocular surface, 10% betadine to eyelids. A 30 gauge needle was used.   Injection: 2.5 mg bevacizumab 2.5 MG/0.1ML   Route: Intravitreal, Site: Left Eye   NDC: [phone], Lot: [id number]   Post-op Post injection exam found visual acuity of at least counting fingers. The patient tolerated the procedure well. There were no complications. The patient received written and verbal post procedure care education. Post injection medications included ocuflox.      OCT, Retina - OU - Both Eyes       Left Eye Quality was good. Scan locations included subfoveal. Central Foveal Thickness: 844. Findings include abnormal foveal contour.   Notes Slightly less subretinal fluid and intraretinal fluid OS, post injection Avastin No. 1             ASSESSMENT/PLAN:  Exudative age-related macular degeneration of left eye with active choroidal neovascularization (HCC) Slight improvement post injection #1 Avastin OS.  We will repeat injection today.  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  This reason I recommended use of intravitreal  Eylea, insurance denied     ICD-10-CM   1. Exudative age-related macular degeneration of left  eye with active choroidal neovascularization (HCC)  H35.3221 Intravitreal Injection, Pharmacologic Agent - OS - Left Eye    OCT, Retina - OU - Both Eyes    bevacizumab (AVASTIN) SOSY 2.5 mg    2. 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  Slightly improved macular findings left eye post injection intravitreal antivegF #1.  Repeat injection Avastin OS today  2.  Follow-up examination in 5 weeks  3.  Ophthalmic Meds Ordered this visit:  Meds ordered this encounter  Medications   bevacizumab (AVASTIN) SOSY 2.5 mg       Return in about 5 weeks (around 03/02/2021) for dilate, OS, AVASTIN OCT.  There are no Patient Instructions on file for this visit.   Explained the diagnoses, plan, and follow up with the patient and they expressed understanding.  Patient expressed understanding of the importance of proper follow up care.   Alford Highland Akacia Boltz M.D. Diseases & Surgery of the Retina and Vitreous [facility] 01/26/21     Abbreviations: M myopia (nearsighted); A astigmatism; H hyperopia (farsighted); P presbyopia; Mrx spectacle prescription;  CTL contact lenses; OD right eye; OS left eye; OU both eyes  XT exotropia; ET esotropia; PEK punctate epithelial keratitis; PEE punctate epithelial erosions; DES dry eye syndrome; MGD meibomian gland dysfunction; ATs artificial tears; PFAT's preservative free artificial tears; NSC nuclear sclerotic cataract; PSC posterior subcapsular cataract; ERM epi-retinal membrane; PVD posterior vitreous detachment; RD retinal detachment; DM diabetes mellitus; DR diabetic retinopathy; NPDR non-proliferative diabetic retinopathy; PDR proliferative diabetic retinopathy; CSME clinically significant macular edema; DME diabetic macular edema; dbh dot blot hemorrhages; CWS cotton wool spot; POAG primary open angle glaucoma; C/D cup-to-disc  ratio; HVF humphrey visual field; GVF goldmann visual field; OCT optical coherence tomography; IOP intraocular pressure; BRVO Branch retinal vein occlusion; CRVO central retinal vein occlusion; CRAO central retinal artery occlusion; BRAO branch retinal artery occlusion; RT retinal tear; SB scleral buckle; PPV pars plana vitrectomy; VH Vitreous hemorrhage; PRP panretinal laser photocoagulation; IVK intravitreal kenalog; VMT vitreomacular traction; MH Macular hole;  NVD neovascularization of the disc; NVE neovascularization elsewhere; AREDS age related eye disease study; ARMD age related macular degeneration; POAG primary open angle glaucoma; EBMD epithelial/anterior basement membrane dystrophy; ACIOL anterior chamber intraocular lens; IOL intraocular lens; PCIOL posterior chamber intraocular lens; Phaco/IOL phacoemulsification with intraocular lens placement; PRK photorefractive keratectomy; LASIK laser assisted in situ keratomileusis; HTN hypertension; DM diabetes mellitus; COPD chronic obstructive pulmonary disease

## 2021-02-02 DIAGNOSIS — H353221 Exudative age-related macular degeneration, left eye, with active choroidal neovascularization: Secondary | ICD-10-CM | POA: Diagnosis not present

## 2021-02-02 MED ORDER — BEVACIZUMAB 2.5 MG/0.1ML IZ SOSY
2.5000 mg | PREFILLED_SYRINGE | INTRAVITREAL | Status: AC | PRN
  Administered 2021-02-02: 2.5 mg via INTRAVITREAL

## 2021-02-02 NOTE — Addendum Note (Signed)
Addended by: Fawn Kirk A on: 02/02/2021 08:17 AM   Modules accepted: Orders

## 2021-03-02 ENCOUNTER — Ambulatory Visit (INDEPENDENT_AMBULATORY_CARE_PROVIDER_SITE_OTHER): Payer: Medicare Other | Admitting: Ophthalmology

## 2021-03-02 ENCOUNTER — Other Ambulatory Visit: Payer: Self-pay

## 2021-03-02 ENCOUNTER — Encounter (INDEPENDENT_AMBULATORY_CARE_PROVIDER_SITE_OTHER): Payer: Self-pay | Admitting: Ophthalmology

## 2021-03-02 DIAGNOSIS — H353221 Exudative age-related macular degeneration, left eye, with active choroidal neovascularization: Secondary | ICD-10-CM

## 2021-03-02 DIAGNOSIS — H318 Other specified disorders of choroid: Secondary | ICD-10-CM | POA: Diagnosis not present

## 2021-03-02 MED ORDER — BEVACIZUMAB 2.5 MG/0.1ML IZ SOSY
2.5000 mg | PREFILLED_SYRINGE | INTRAVITREAL | Status: AC | PRN
  Administered 2021-03-02: 2.5 mg via INTRAVITREAL

## 2021-03-02 NOTE — Progress Notes (Signed)
03/02/2021     CHIEF COMPLAINT Patient presents for  Chief Complaint  Patient presents with   Retina Follow Up      HISTORY OF PRESENT ILLNESS: Barbara Wells is a 65 y.o. female who presents to the clinic today for:   HPI     Retina Follow Up   Patient presents with  Wet AMD.  In left eye.  This started 5 weeks ago.  Duration of 5 weeks.  Since onset it is gradually improving.        Comments   5 week f/u OS with OCT and possible Avastin injection  EyeMeds: Latanoprost QHS OS      Last edited by Frederik Pear, COA on 03/02/2021  9:54 AM.      Referring physician: Littie Deeds, MD [address] [place],  [place] [address]  HISTORICAL INFORMATION:   Selected notes from the MEDICAL RECORD NUMBER    Lab Results  Component Value Date   HGBA1C 6.0 03/06/2019     CURRENT MEDICATIONS: Current Outpatient Medications (Ophthalmic Drugs)  Medication Sig   latanoprost (XALATAN) 0.005 % ophthalmic solution Place 1 drop into both eyes at bedtime.   No current facility-administered medications for this visit. (Ophthalmic Drugs)   Current Outpatient Medications (Other)  Medication Sig   acetaminophen (TYLENOL) 500 MG tablet Take 1,000 mg by mouth every 6 (six) hours as needed for headache (pain).    albuterol (VENTOLIN HFA) 108 (90 Base) MCG/ACT inhaler Inhale 2 puffs into the lungs every 6 (six) hours as needed for wheezing or shortness of breath.   aspirin EC 81 MG tablet Take 1 tablet (81 mg total) by mouth daily.   atorvastatin (LIPITOR) 40 MG tablet Take 1 tablet (40 mg total) by mouth daily.   ibuprofen (ADVIL,MOTRIN) 200 MG tablet Take 200 mg by mouth every 6 (six) hours as needed for headache (pain).   Multiple Vitamin (MULTIVITAMIN WITH MINERALS) TABS tablet Take 1 tablet by mouth at bedtime.   No current facility-administered medications for this visit. 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 No pertinent surgical history.  FAMILY HISTORY Family History  Problem Relation Age of Onset   Stroke Mother    Hypertension Mother    Heart disease Father    Kidney disease Brother    Hernia Son    ADD / ADHD Son     SOCIAL HISTORY Social History   Tobacco Use   Smoking status: Never   Smokeless tobacco: Never  Substance Use Topics   Alcohol use: No   Drug use: No         OPHTHALMIC EXAM:  Base Eye Exam     Visual Acuity (ETDRS)       Right Left   Dist cc LP 20/160   Dist ph cc  NI    Correction: Glasses         Tonometry (Tonopen, 10:04 AM)       Right Left   Pressure 37 13  Pupils       Dark Light Shape React APD   Right 6 6 Round no +2   Left 6 5 Round Brisk None         Visual Fields       Left Right    Full    Restrictions  Total superior temporal, inferior temporal, superior nasal, inferior nasal deficiencies         Extraocular Movement       Right Left    Full, Ortho Full, Ortho         Neuro/Psych     Oriented x3: Yes   Mood/Affect: Normal         Dilation     Left eye: 1.0% Mydriacyl, 2.5% Phenylephrine @ 10:01 AM           Slit Lamp and Fundus Exam     External Exam       Right Left   External Normal Normal         Slit Lamp Exam       Right Left   Lids/Lashes Normal Normal   Conjunctiva/Sclera White and quiet White and quiet   Cornea Clear Clear   Anterior Chamber Deep and quiet Deep and quiet   Iris Round and reactive Round and reactive   Lens Clear Clear   Anterior Vitreous Normal Normal         Fundus Exam       Right Left   Posterior  Vitreous Normal Normal   Disc  Normal   C/D Ratio  0.55   Macula  Subretinal neovascular membrane, with subretinal hemorrhage looks like polypoidal with nodular growths   Vessels  Normal   Periphery  Inferior apparent exudative RD, chronic with exudates in the retina but also subretinal fluid slightly less extensive.  No retinal breaks noted            IMAGING AND PROCEDURES  Imaging and Procedures for 03/02/21  OCT, Retina - OU - Both Eyes       Left Eye Quality was good. Scan locations included subfoveal. Central Foveal Thickness: 864. Progression has been stable. Findings include abnormal foveal contour, subretinal hyper-reflective material, intraretinal hyper-reflective material, cystoid macular edema, choroidal neovascular membrane, disciform scar.   Notes Slightly less subretinal fluid and intraretinal fluid OS, post injection Avastin No. 2, repeat today to control     Intravitreal Injection, Pharmacologic Agent - OS - Left Eye       Time Out 03/02/2021. 10:52 AM. Confirmed correct patient, procedure, site, and patient consented.   Anesthesia Topical anesthesia was used. Anesthetic medications included Lidocaine 4%.   Procedure Preparation included 5% betadine to ocular surface, 10% betadine to eyelids, Ofloxacin , Tobramycin 0.3%. A 30 gauge needle was used.   Injection: 2.5 mg bevacizumab 2.5 MG/0.1ML   Route: Intravitreal, Site: Left Eye   NDC: [phone], Lot: [id number]   Post-op Post injection exam found visual acuity of at least counting fingers. The patient tolerated the procedure well. There were no complications. The patient received written and verbal post procedure care education. Post injection medications included ocuflox.   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 Exudative age-related macular degeneration of left eye with active choroidal neovascularization (HCC)  H35.3221 OCT, Retina - OU - Both Eyes    Intravitreal Injection, Pharmacologic Agent - OS - Left Eye    bevacizumab (AVASTIN) SOSY 2.5 mg    CANCELED: Intravitreal Injection, Pharmacologic Agent - OD - Right Eye    2. 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  OS improved macular anatomy and still active subfoveal disciform scar compound on polypoidal choroidopathy.  Repeat injection Avastin today to maintain visual functioning in this monocular patient  2.  3.  Ophthalmic Meds Ordered this visit:  Meds ordered this encounter  Medications   bevacizumab (AVASTIN) SOSY 2.5 mg       Return in about 5 weeks (around 04/06/2021) for dilate, OS, AVASTIN OCT.  There are no Patient Instructions on file for this visit.   Explained the diagnoses, plan, and follow up with the patient and they expressed understanding.  Patient expressed understanding of the importance of proper follow up care.   Alford Highland Heidemarie Goodnow M.D. Diseases & Surgery of the Retina and Vitreous [facility] 03/02/21     Abbreviations: M myopia (nearsighted); A astigmatism; H hyperopia (farsighted); P presbyopia; Mrx spectacle prescription;  CTL contact lenses; OD right eye; OS left eye; OU both eyes  XT exotropia; ET esotropia; PEK punctate epithelial keratitis; PEE punctate epithelial erosions; DES dry eye syndrome; MGD meibomian gland dysfunction; ATs artificial tears; PFAT's preservative free artificial tears; NSC nuclear sclerotic cataract; PSC posterior subcapsular cataract; ERM epi-retinal membrane; PVD posterior vitreous detachment; RD retinal detachment; DM diabetes  mellitus; DR diabetic retinopathy; NPDR non-proliferative diabetic retinopathy; PDR proliferative diabetic retinopathy; CSME clinically significant macular edema; DME diabetic macular edema; dbh dot blot hemorrhages; CWS cotton wool spot; POAG primary open angle glaucoma; C/D cup-to-disc ratio; HVF humphrey visual field; GVF goldmann visual field; OCT optical coherence tomography; IOP intraocular pressure; BRVO Branch retinal vein occlusion; CRVO central retinal vein occlusion; CRAO central retinal artery occlusion; BRAO branch retinal artery occlusion; RT retinal tear; SB scleral buckle; PPV pars plana vitrectomy; VH Vitreous hemorrhage; PRP panretinal laser photocoagulation; IVK intravitreal kenalog; VMT vitreomacular traction; MH Macular hole;  NVD neovascularization of the disc; NVE neovascularization elsewhere; AREDS age related eye disease study; ARMD age related macular degeneration; POAG primary open angle glaucoma; EBMD epithelial/anterior basement membrane dystrophy; ACIOL anterior chamber intraocular lens; IOL intraocular lens; PCIOL posterior chamber intraocular lens; Phaco/IOL phacoemulsification with intraocular lens placement; PRK photorefractive keratectomy; LASIK laser assisted in situ keratomileusis; HTN hypertension; DM diabetes mellitus; COPD chronic obstructive pulmonary disease

## 2021-03-02 NOTE — Assessment & Plan Note (Signed)
Improved postinjection #2 Avastin, will repeat today again to maintain as patient notices an improved acuity

## 2021-03-02 NOTE — Assessment & Plan Note (Signed)
Component of wet AMD noted

## 2021-04-08 ENCOUNTER — Encounter (INDEPENDENT_AMBULATORY_CARE_PROVIDER_SITE_OTHER): Payer: Medicare Other | Admitting: Ophthalmology

## 2021-04-12 ENCOUNTER — Ambulatory Visit (INDEPENDENT_AMBULATORY_CARE_PROVIDER_SITE_OTHER): Payer: Medicare Other | Admitting: Ophthalmology

## 2021-04-12 ENCOUNTER — Other Ambulatory Visit: Payer: Self-pay

## 2021-04-12 ENCOUNTER — Encounter (INDEPENDENT_AMBULATORY_CARE_PROVIDER_SITE_OTHER): Payer: Self-pay | Admitting: Ophthalmology

## 2021-04-12 DIAGNOSIS — H3322 Serous retinal detachment, left eye: Secondary | ICD-10-CM

## 2021-04-12 DIAGNOSIS — H353221 Exudative age-related macular degeneration, left eye, with active choroidal neovascularization: Secondary | ICD-10-CM

## 2021-04-12 DIAGNOSIS — H318 Other specified disorders of choroid: Secondary | ICD-10-CM

## 2021-04-12 MED ORDER — BEVACIZUMAB 2.5 MG/0.1ML IZ SOSY
2.5000 mg | PREFILLED_SYRINGE | INTRAVITREAL | Status: AC | PRN
  Administered 2021-04-12: 2.5 mg via INTRAVITREAL

## 2021-04-12 NOTE — Progress Notes (Signed)
04/12/2021     CHIEF COMPLAINT Patient presents for  Chief Complaint  Patient presents with   Retina Follow Up      HISTORY OF PRESENT ILLNESS: Barbara Wells is a 66 y.o. female who presents to the clinic today for:   HPI     Retina Follow Up           Diagnosis: Wet AMD   Laterality: left eye   Onset: 6 weeks ago   Duration: 6 weeks   Course: gradually improving         Comments   5 weeks & 6 days fu Dilate OS, Avastin OS oct. Pt states vision has improved. Denies new FOL or floaters. Pt is using Latanoprost QHS OS, Tobramycin BID OS.      Last edited by Nelva Nay on 04/12/2021 10:58 AM.      Referring physician: Littie Deeds, MD [address] [place],  [place] [address]  HISTORICAL INFORMATION:   Selected notes from the MEDICAL RECORD NUMBER    Lab Results  Component Value Date   HGBA1C 6.0 03/06/2019     CURRENT MEDICATIONS: Current Outpatient Medications (Ophthalmic Drugs)  Medication Sig   latanoprost (XALATAN) 0.005 % ophthalmic solution Place 1 drop into both eyes at bedtime.   No current facility-administered medications for this visit. (Ophthalmic Drugs)   Current Outpatient Medications (Other)  Medication Sig   acetaminophen (TYLENOL) 500 MG tablet Take 1,000 mg by mouth every 6 (six) hours as needed for headache (pain).    albuterol (VENTOLIN HFA) 108 (90 Base) MCG/ACT inhaler Inhale 2 puffs into the lungs every 6 (six) hours as needed for wheezing or shortness of breath.   aspirin EC 81 MG tablet Take 1 tablet (81 mg total) by mouth daily.   atorvastatin (LIPITOR) 40 MG tablet Take 1 tablet (40 mg total) by mouth daily.   ibuprofen (ADVIL,MOTRIN) 200 MG tablet Take 200 mg by mouth every 6 (six) hours as needed for headache (pain).   Multiple Vitamin (MULTIVITAMIN WITH MINERALS) TABS tablet Take 1 tablet by mouth at bedtime.   No current facility-administered medications for this visit. 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 No pertinent surgical history.  FAMILY HISTORY Family History  Problem Relation Age of Onset   Stroke Mother    Hypertension Mother    Heart disease Father    Kidney disease Brother    Hernia Son    ADD / ADHD Son     SOCIAL HISTORY Social History   Tobacco Use   Smoking status: Never   Smokeless tobacco: Never  Substance Use Topics   Alcohol use: No   Drug use: No         OPHTHALMIC EXAM:  Base Eye Exam     Visual Acuity (ETDRS)       Right Left   Dist cc LP 20/200   Dist ph cc  NI    Correction: Glasses  Tonometry (Tonopen, 11:01 AM)       Right Left   Pressure 43 19         Pupils       Dark Light Shape React APD   Right 6 6 Irregular  None   Left 6 5 Round Brisk None         Visual Fields (Counting fingers)       Left Right   Restrictions  Total superior temporal, inferior temporal, superior nasal, inferior nasal deficiencies         Extraocular Movement       Right Left    Full Full         Neuro/Psych     Oriented x3: Yes   Mood/Affect: Normal         Dilation     Left eye: 1.0% Mydriacyl, 2.5% Phenylephrine @ 11:01 AM           Slit Lamp and Fundus Exam     External Exam       Right Left   External Normal Normal         Slit Lamp Exam       Right Left   Lids/Lashes Normal Normal   Conjunctiva/Sclera White and quiet White and quiet   Cornea Clear Clear   Anterior Chamber Deep and quiet Deep and quiet   Iris Round and reactive Round and reactive   Lens Clear Clear   Anterior Vitreous  Normal Normal         Fundus Exam       Right Left   Posterior Vitreous Normal Normal   Disc  Normal   C/D Ratio  0.55   Macula  Subretinal neovascular membrane, with subretinal hemorrhage looks like polypoidal with nodular growths   Vessels  Normal   Periphery  Inferior apparent exudative RD, chronic with exudates in the retina but also subretinal fluid slightly less extensive.  No retinal breaks noted            IMAGING AND PROCEDURES  Imaging and Procedures for 04/12/21  Intravitreal Injection, Pharmacologic Agent - OS - Left Eye       Time Out 04/12/2021. 11:23 AM. Confirmed correct patient, procedure, site, and patient consented.   Anesthesia Topical anesthesia was used. Anesthetic medications included Lidocaine 4%.   Procedure Preparation included 5% betadine to ocular surface, 10% betadine to eyelids, Ofloxacin , Tobramycin 0.3%. A 30 gauge needle was used.   Injection: 2.5 mg bevacizumab 2.5 MG/0.1ML   Route: Intravitreal, Site: Left Eye   NDC: [phone], Lot: [id number]   Post-op Post injection exam found visual acuity of at least counting fingers. The patient tolerated the procedure well. There were no complications. The patient received written and verbal post procedure care education. Post injection medications included ocuflox.   Notes OS post intravitreal Avastin, improved macular anatomy repeat again today     OCT, Retina - OU - Both Eyes       Left Eye Quality was good. Scan locations included subfoveal. Central Foveal Thickness: 906. Progression has been stable. Findings include abnormal foveal contour, subretinal hyper-reflective material, intraretinal hyper-reflective material, cystoid macular edema, choroidal neovascular membrane, disciform scar, epiretinal membrane, macular pucker, vitreous traction.   Notes Slightly less subretinal fluid and intraretinal fluid OS, post injection Avastin with stable acuity, yet still active.  Repeat injection  Avastin today follow-[facility] next in 5 weeks [facility]  ASSESSMENT/PLAN:  Idiopathic polypoidal choroidal vasculopathy Likely responsible for relative insensitivity to antivegF.  Exudative retinal detachment of left eye Slightly improved inferiorly  Exudative age-related macular degeneration of left eye with active choroidal neovascularization (HCC) Repeat injection Avastin OS today and follow-up 5 weeks and likely change to Eylea and OS next     ICD-10-CM   1. Exudative age-related macular degeneration of left eye with active choroidal neovascularization (HCC)  H35.3221 Intravitreal Injection, Pharmacologic Agent - OS - Left Eye    OCT, Retina - OU - Both Eyes    bevacizumab (AVASTIN) SOSY 2.5 mg    2. 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 Exudative retinal detachment of left eye  H33.22       1.  OS with chronically active subfoveal large CNVM with elements that suggest polypoidal yet the large inferior subretinal fluid extension has diminished greatly  2.  Repeat injection Avastin OS today follow-up next and likely inject Eylea  3.  Ophthalmic Meds Ordered this visit:  Meds ordered this encounter  Medications   bevacizumab (AVASTIN) SOSY 2.5 mg       Return in about 5 weeks (around 05/17/2021) for dilate, OS, EYLEA OCT,, if good days co-pay assist active.  There are no Patient Instructions on file for this visit.   Explained the diagnoses, plan, and follow up with the patient and they expressed understanding.  Patient expressed understanding of the importance of proper follow up care.   Alford Highland Samantha Ragen M.D. Diseases & Surgery of the Retina and Vitreous [facility] 04/12/21     Abbreviations: M myopia (nearsighted); A astigmatism; H hyperopia (farsighted); P presbyopia; Mrx spectacle prescription;  CTL contact lenses; OD right eye; OS left eye; OU both eyes  XT exotropia; ET esotropia; PEK punctate  epithelial keratitis; PEE punctate epithelial erosions; DES dry eye syndrome; MGD meibomian gland dysfunction; ATs artificial tears; PFAT's preservative free artificial tears; NSC nuclear sclerotic cataract; PSC posterior subcapsular cataract; ERM epi-retinal membrane; PVD posterior vitreous detachment; RD retinal detachment; DM diabetes mellitus; DR diabetic retinopathy; NPDR non-proliferative diabetic retinopathy; PDR proliferative diabetic retinopathy; CSME clinically significant macular edema; DME diabetic macular edema; dbh dot blot hemorrhages; CWS cotton wool spot; POAG primary open angle glaucoma; C/D cup-to-disc ratio; HVF humphrey visual field; GVF goldmann visual field; OCT optical coherence tomography; IOP intraocular pressure; BRVO Branch retinal vein occlusion; CRVO central retinal vein occlusion; CRAO central retinal artery occlusion; BRAO branch retinal artery occlusion; RT retinal tear; SB scleral buckle; PPV pars plana vitrectomy; VH Vitreous hemorrhage; PRP panretinal laser photocoagulation; IVK intravitreal kenalog; VMT vitreomacular traction; MH Macular hole;  NVD neovascularization of the disc; NVE neovascularization elsewhere; AREDS age related eye disease study; ARMD age related macular degeneration; POAG primary open angle glaucoma; EBMD epithelial/anterior basement membrane dystrophy; ACIOL anterior chamber intraocular lens; IOL intraocular lens; PCIOL posterior chamber intraocular lens; Phaco/IOL phacoemulsification with intraocular lens placement; PRK photorefractive keratectomy; LASIK laser assisted in situ keratomileusis; HTN hypertension; DM diabetes mellitus; COPD chronic obstructive pulmonary disease

## 2021-04-12 NOTE — Assessment & Plan Note (Signed)
Likely responsible for relative insensitivity to antivegF.

## 2021-04-12 NOTE — Assessment & Plan Note (Signed)
Repeat injection Avastin OS today and follow-up 5 weeks and likely change to Eylea and OS next

## 2021-04-12 NOTE — Assessment & Plan Note (Signed)
Slightly improved inferiorly

## 2021-05-17 ENCOUNTER — Encounter (INDEPENDENT_AMBULATORY_CARE_PROVIDER_SITE_OTHER): Payer: Medicare Other | Admitting: Ophthalmology

## 2021-05-24 ENCOUNTER — Other Ambulatory Visit: Payer: Self-pay

## 2021-05-24 ENCOUNTER — Ambulatory Visit (INDEPENDENT_AMBULATORY_CARE_PROVIDER_SITE_OTHER): Payer: Medicare Other | Admitting: Ophthalmology

## 2021-05-24 ENCOUNTER — Encounter (INDEPENDENT_AMBULATORY_CARE_PROVIDER_SITE_OTHER): Payer: Self-pay | Admitting: Ophthalmology

## 2021-05-24 DIAGNOSIS — H353221 Exudative age-related macular degeneration, left eye, with active choroidal neovascularization: Secondary | ICD-10-CM | POA: Diagnosis not present

## 2021-05-24 DIAGNOSIS — H318 Other specified disorders of choroid: Secondary | ICD-10-CM | POA: Diagnosis not present

## 2021-05-24 MED ORDER — AFLIBERCEPT 2MG/0.05ML IZ SOLN FOR KALEIDOSCOPE
2.0000 mg | INTRAVITREAL | Status: AC | PRN
  Administered 2021-05-24: 2 mg via INTRAVITREAL

## 2021-05-24 NOTE — Patient Instructions (Addendum)
It was nice seeing you today!  Use Vaseline and try not to wash your arm too much. Too much water makes your skin dry.  Stay well, Zola Button, MD [place] [phone]  --  Make sure to check out at the front desk before you leave today.  Please arrive at least 15 minutes prior to your scheduled appointments.  If you had blood work today, I will send you a MyChart message or a letter if results are normal. Otherwise, I will give you a call.  If you had a referral placed, they will call you to set up an appointment. Please give [place] a call if you don't hear back in the next 2 weeks.  If you need additional refills before your next appointment, please call your pharmacy first.

## 2021-05-24 NOTE — Assessment & Plan Note (Signed)
OS, active subfoveal CNVM with exudative changes.  Persistent slightly improved macular condition on Avastin yet we will change to [facility] today for the chance for enhanced efficacy

## 2021-05-24 NOTE — Progress Notes (Addendum)
05/24/2021     CHIEF COMPLAINT Patient presents for  Chief Complaint  Patient presents with   Macular Degeneration      HISTORY OF PRESENT ILLNESS: Barbara Wells is a 66 y.o. female who presents to the clinic today for:   HPI   Patient reports doing "much better"   Last edited by Edmon Crape, MD on 05/24/2021 11:24 AM.      Referring physician: Littie Deeds, MD [address] [place],  [place] [address]  HISTORICAL INFORMATION:   Selected notes from the MEDICAL RECORD NUMBER    Lab Results  Component Value Date   HGBA1C 6.0 03/06/2019     CURRENT MEDICATIONS: Current Outpatient Medications (Ophthalmic Drugs)  Medication Sig   latanoprost (XALATAN) 0.005 % ophthalmic solution Place 1 drop into both eyes at bedtime.   No current facility-administered medications for this visit. (Ophthalmic Drugs)   Current Outpatient Medications (Other)  Medication Sig   acetaminophen (TYLENOL) 500 MG tablet Take 1,000 mg by mouth every 6 (six) hours as needed for headache (pain).    albuterol (VENTOLIN HFA) 108 (90 Base) MCG/ACT inhaler Inhale 2 puffs into the lungs every 6 (six) hours as needed for wheezing or shortness of breath.   aspirin EC 81 MG tablet Take 1 tablet (81 mg total) by mouth daily.   atorvastatin (LIPITOR) 40 MG tablet Take 1 tablet (40 mg total) by mouth daily.   ibuprofen (ADVIL,MOTRIN) 200 MG tablet Take 200 mg by mouth every 6 (six) hours as needed for headache (pain).   Multiple Vitamin (MULTIVITAMIN WITH MINERALS) TABS tablet Take 1 tablet by mouth at bedtime.   No current facility-administered medications for this visit. (Other)      REVIEW OF SYSTEMS: ROS   Negative for: Constitutional, Gastrointestinal, Neurological, Skin, Genitourinary, Musculoskeletal, HENT, Endocrine, Cardiovascular, Eyes, Respiratory, Psychiatric, Allergic/Imm, Heme/Lymph Last edited by Edmon Crape, MD on 05/24/2021 10:22 AM.       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  FAMILY HISTORY Family History  Problem Relation Age of Onset   Stroke Mother    Hypertension Mother    Heart disease Father    Kidney disease Brother    Hernia Son    ADD / ADHD Son     SOCIAL HISTORY Social History   Tobacco Use   Smoking status: Never   Smokeless tobacco: Never  Substance Use Topics   Alcohol use: No   Drug use: No         OPHTHALMIC EXAM:  Base Eye Exam     Visual Acuity (ETDRS)       Right Left   Dist Agar  20/400         Tonometry (Tonopen, 10:25 AM)       Right Left   Pressure 44 4         Neuro/Psych     Oriented x3: Yes   Mood/Affect: Normal         Dilation     Left eye: 1.0% Mydriacyl, 2.5% Phenylephrine @  10:24 AM           Slit Lamp and Fundus Exam     External Exam       Right Left   External Normal Normal         Slit Lamp Exam       Right Left   Lids/Lashes Normal Normal   Conjunctiva/Sclera White and quiet White and quiet   Cornea Clear Clear   Anterior Chamber Deep and quiet Deep and quiet   Iris  Round and reactive   Lens Clear Clear   Anterior Vitreous Normal Normal         Fundus Exam       Right Left   Posterior Vitreous Normal Normal   Disc  Normal   C/D Ratio  0.55   Macula  Subretinal neovascular membrane, with subretinal hemorrhage looks like polypoidal with nodular growths   Vessels  Normal   Periphery  Inferior apparent exudative RD, chronic with exudates in the retina but also subretinal fluid slightly less extensive.  No retinal breaks noted            IMAGING AND PROCEDURES  Imaging and  Procedures for 05/24/21  OCT, Retina - OU - Both Eyes       Left Eye Quality was good. Scan locations included subfoveal. Central Foveal Thickness: 734. Progression has been stable. Findings include abnormal foveal contour, subretinal hyper-reflective material, intraretinal hyper-reflective material, cystoid macular edema, choroidal neovascular membrane, disciform scar, epiretinal membrane, macular pucker, vitreous traction.   Notes Slightly less subretinal fluid and intraretinal fluid OS, post injection Avastin with stable acuity, yet still active.  Today we will change medication to [facility] for hopefully enhanced efficacy     Intravitreal Injection, Pharmacologic Agent - OS - Left Eye       Time Out 05/24/2021. 11:25 AM. Confirmed correct patient, procedure, site, and patient consented.   Anesthesia Topical anesthesia was used. Anesthetic medications included Lidocaine 4%.   Procedure Preparation included 5% betadine to ocular surface, 10% betadine to eyelids, Ofloxacin , Tobramycin 0.3%. A 30 gauge needle was used.   Injection: 2 mg aflibercept 2 MG/0.05ML   Route: Intravitreal, Site: Left Eye   NDC: [id number], Lot: [id number], Waste: 0 mL   Post-op Post injection exam found visual acuity of at least counting fingers. The patient tolerated the procedure well. There were no complications. The patient received written and verbal post procedure care education. Post injection medications included ocuflox.   Notes OS post intravitreal Avastin, improved macular anatomy repeat again today             ASSESSMENT/PLAN:  Exudative age-related macular degeneration of left eye with active choroidal neovascularization (HCC) OS, active subfoveal CNVM with exudative changes.  Persistent slightly improved macular condition on Avastin yet we will change to [facility] today for the chance for enhanced efficacy     ICD-10-CM   1. Exudative age-related macular degeneration of left eye  with active choroidal neovascularization (HCC)  H35.3221 OCT, Retina - OU - Both Eyes    Intravitreal Injection, Pharmacologic Agent - OS - Left Eye    aflibercept (EYLEA) SOLN 2 mg    2. 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  OS, vastly improved yet still active massive subfoveal CNVM likely related to polypoidal.  Previously treated with Avastin, will treat today with Eylea No. 1, follow-up again in 5 weeks  2.  3.  Ophthalmic Meds Ordered this visit:  Meds ordered this encounter  Medications   aflibercept (EYLEA) SOLN 2 mg       Return in 5 weeks (on 06/28/2021) for dilate, OS, EYLEA OCT.  There are no Patient Instructions on file for this visit.   Explained the diagnoses, plan, and follow up with the patient and they expressed understanding.  Patient expressed understanding of the importance of proper follow up care.   Alford Highland Baden Betsch M.D. Diseases & Surgery of the Retina and Vitreous [facility] 05/24/21     Abbreviations: M myopia (nearsighted); A astigmatism; H hyperopia (farsighted); P presbyopia; Mrx spectacle prescription;  CTL contact lenses; OD right eye; OS left eye; OU both eyes  XT exotropia; ET esotropia; PEK punctate epithelial keratitis; PEE punctate epithelial erosions; DES dry eye syndrome; MGD meibomian gland dysfunction; ATs artificial tears; PFAT's preservative free artificial tears; NSC nuclear sclerotic cataract; PSC posterior subcapsular cataract; ERM epi-retinal membrane; PVD posterior vitreous detachment; RD retinal detachment; DM diabetes mellitus; DR diabetic retinopathy; NPDR non-proliferative diabetic retinopathy; PDR proliferative diabetic retinopathy; CSME clinically significant macular edema; DME diabetic macular edema; dbh dot blot hemorrhages; CWS cotton wool spot; POAG primary open angle glaucoma; C/D cup-to-disc ratio; HVF humphrey visual field; GVF goldmann visual field; OCT  optical coherence tomography; IOP intraocular pressure; BRVO Branch retinal vein occlusion; CRVO central retinal vein occlusion; CRAO central retinal artery occlusion; BRAO branch retinal artery occlusion; RT retinal tear; SB scleral buckle; PPV pars plana vitrectomy; VH Vitreous hemorrhage; PRP panretinal laser photocoagulation; IVK intravitreal kenalog; VMT vitreomacular traction; MH Macular hole;  NVD neovascularization of the disc; NVE neovascularization elsewhere; AREDS age related eye disease study; ARMD age related macular degeneration; POAG primary open angle glaucoma; EBMD epithelial/anterior basement membrane dystrophy; ACIOL anterior chamber intraocular lens; IOL intraocular lens; PCIOL posterior chamber intraocular lens; Phaco/IOL phacoemulsification with intraocular lens placement; PRK photorefractive keratectomy; LASIK laser assisted in situ keratomileusis; HTN hypertension; DM diabetes mellitus; COPD chronic obstructive pulmonary disease

## 2021-05-24 NOTE — Progress Notes (Signed)
° ° °  SUBJECTIVE:   CHIEF COMPLAINT / HPI:  Chief Complaint  Patient presents with   Annual Exam    Rash on right arm and hand ongoing for several weeks.  She believes this is due to the COVID-vaccine which she got 1 year ago.  She does wash her right hand and arm frequently.  Does not itch.  Lives with 2 of her sons.  She feels strongly against vaccinations.  PERTINENT  PMH / PSH: left thyroid nodule, PE, HLD, CAD, macular degeneration  Patient Care Team: Zola Button, MD as PCP - General (Family Medicine) Jettie Booze, MD as PCP - Cardiology (Cardiology)   OBJECTIVE:   BP 135/76    Pulse 93    Ht 5\' 5"  (1.651 m)    Wt 227 lb 6.4 oz (103.1 kg)    SpO2 99%    BMI 37.84 kg/m   Physical Exam Constitutional:      General: She is not in acute distress. Cardiovascular:     Rate and Rhythm: Normal rate and regular rhythm.  Pulmonary:     Effort: Pulmonary effort is normal. No respiratory distress.     Breath sounds: Normal breath sounds.  Musculoskeletal:     Cervical back: Neck supple.  Skin:    Comments: Extensive dry skin with scaling noted on the right forearm and hand.  Neurological:     Mental Status: She is alert.     Depression screen [facility] 2/9 05/18/2020  Decreased Interest 0  Down, Depressed, Hopeless 0  PHQ - 2 Score 0  Altered sleeping 0  Tired, decreased energy 0  Change in appetite 0  Feeling bad or failure about yourself  0  Trouble concentrating 0  Moving slowly or fidgety/restless 0  Suicidal thoughts 0  PHQ-9 Score 0     {Show previous vital signs (optional):23777}    ASSESSMENT/PLAN:   Dry skin Noted on right forearm and hand secondary to frequent washing/water exposure.  Advised to reduce washing and use Vaseline more often.  Left thyroid nodule Noted on prior ultrasound.  Patient again declined thyroid ultrasound.  She states she would not want surgery for this anyway.  TSH has been normal in the past, do not feel any benefit in  repeating this.   HCM - labs: A1c, lipid panel, CBC and BMP per patient request - Tdap declined - shingles vaccine declined - pneumonia vaccine declined - flu shot declined - Covid bivalent booster declined - mammogram declined - Cologuard ordered  Return in about 1 year (around 05/25/2022) for physical.   Zola Button, [place]

## 2021-05-25 ENCOUNTER — Ambulatory Visit (INDEPENDENT_AMBULATORY_CARE_PROVIDER_SITE_OTHER): Payer: Medicare Other | Admitting: Family Medicine

## 2021-05-25 ENCOUNTER — Encounter: Payer: Self-pay | Admitting: Family Medicine

## 2021-05-25 VITALS — BP 135/76 | HR 93 | Ht 65.0 in | Wt 227.4 lb

## 2021-05-25 DIAGNOSIS — E041 Nontoxic single thyroid nodule: Secondary | ICD-10-CM | POA: Diagnosis not present

## 2021-05-25 DIAGNOSIS — Z0000 Encounter for general adult medical examination without abnormal findings: Secondary | ICD-10-CM | POA: Diagnosis not present

## 2021-05-25 DIAGNOSIS — Z1211 Encounter for screening for malignant neoplasm of colon: Secondary | ICD-10-CM | POA: Diagnosis not present

## 2021-05-25 DIAGNOSIS — E785 Hyperlipidemia, unspecified: Secondary | ICD-10-CM | POA: Diagnosis not present

## 2021-05-25 DIAGNOSIS — L853 Xerosis cutis: Secondary | ICD-10-CM

## 2021-05-25 MED ORDER — ATORVASTATIN CALCIUM 40 MG PO TABS: 40.0000 mg | ORAL_TABLET | Freq: Every day | ORAL | 3 refills | Status: DC

## 2021-05-25 NOTE — Assessment & Plan Note (Signed)
Noted on prior ultrasound.  Patient again declined thyroid ultrasound.  She states she would not want surgery for this anyway.  TSH has been normal in the past, do not feel any benefit in repeating this.

## 2021-05-26 LAB — CBC
Hematocrit: 38.5 % (ref 34.0–46.6)
Hemoglobin: 12.6 g/dL (ref 11.1–15.9)
MCH: 25.5 pg — ABNORMAL LOW (ref 26.6–33.0)
MCHC: 32.7 g/dL (ref 31.5–35.7)
MCV: 78 fL — ABNORMAL LOW (ref 79–97)
Platelets: 225 10*3/uL (ref 150–450)
RBC: 4.94 x10E6/uL (ref 3.77–5.28)
RDW: 14.5 % (ref 11.7–15.4)
WBC: 7.4 10*3/uL (ref 3.4–10.8)

## 2021-05-26 LAB — LIPID PANEL
Chol/HDL Ratio: 3.8 ratio (ref 0.0–4.4)
Cholesterol, Total: 114 mg/dL (ref 100–199)
HDL: 30 mg/dL — ABNORMAL LOW (ref >39)
LDL Chol Calc (NIH): 47 mg/dL (ref 0–99)
Triglycerides: 234 mg/dL — ABNORMAL HIGH (ref 0–149)
VLDL Cholesterol Cal: 37 mg/dL (ref 5–40)

## 2021-05-26 LAB — BASIC METABOLIC PANEL
BUN/Creatinine Ratio: 16 (ref 12–28)
BUN: 11 mg/dL (ref 8–27)
CO2: 19 mmol/L — ABNORMAL LOW (ref 20–29)
Calcium: 9.5 mg/dL (ref 8.7–10.3)
Chloride: 105 mmol/L (ref 96–106)
Creatinine, Ser: 0.68 mg/dL (ref 0.57–1.00)
Glucose: 143 mg/dL — ABNORMAL HIGH (ref 70–99)
Potassium: 4.8 mmol/L (ref 3.5–5.2)
Sodium: 144 mmol/L (ref 134–144)
eGFR: 95 mL/min/{1.73_m2} (ref >59)

## 2021-05-26 LAB — HEMOGLOBIN A1C
Est. average glucose Bld gHb Est-mCnc: 140 mg/dL
Hgb A1c MFr Bld: 6.5 % — ABNORMAL HIGH (ref 4.8–5.6)

## 2021-05-27 ENCOUNTER — Telehealth: Payer: Self-pay | Admitting: Family Medicine

## 2021-05-27 NOTE — Telephone Encounter (Signed)
I have added her to the recall list.   Thank you! Barbara Wells

## 2021-05-27 NOTE — Telephone Encounter (Signed)
Called patient to discuss labs. A1c elevated at 6.5.  Discussed lifestyle changes, advised to reduce carbohydrates. Continue exercising 5 days per week. Plan to re-check A1c in 3 months.

## 2021-06-22 ENCOUNTER — Other Ambulatory Visit: Payer: Self-pay

## 2021-06-22 ENCOUNTER — Encounter (INDEPENDENT_AMBULATORY_CARE_PROVIDER_SITE_OTHER): Payer: Self-pay | Admitting: Ophthalmology

## 2021-06-22 ENCOUNTER — Ambulatory Visit (INDEPENDENT_AMBULATORY_CARE_PROVIDER_SITE_OTHER): Payer: Medicare Other | Admitting: Ophthalmology

## 2021-06-22 DIAGNOSIS — H353221 Exudative age-related macular degeneration, left eye, with active choroidal neovascularization: Secondary | ICD-10-CM | POA: Diagnosis not present

## 2021-06-22 MED ORDER — AFLIBERCEPT 2MG/0.05ML IZ SOLN FOR KALEIDOSCOPE
2.0000 mg | INTRAVITREAL | Status: AC | PRN
  Administered 2021-06-22: 2 mg via INTRAVITREAL

## 2021-06-22 NOTE — Progress Notes (Signed)
? ? ?06/22/2021 ? ?  ? ?CHIEF COMPLAINT ?Patient presents for  ?Chief Complaint  ?Patient presents with  ? Macular Degeneration  ? ? ?Monocular patient OS, she reports improved acuity post Eylea No. 1 ? ?HISTORY OF PRESENT ILLNESS: ?Barbara Wells is a 66 y.o. female who presents to the clinic today for:  ? ?HPI   ?With history of polypoidal form of choroidal neovascular membrane with large amount of subfoveal subretinal exudates large fibrotic white scar as well.  Slightly responsive to Avastin intravitreal in the past, changed to intravitreal Eylea OS last visit  follow-up examination today patient reports, some improvement in acuity subjectively ?Last edited by Hurman Horn, MD on 06/22/2021  2:35 PM.  ?  ? ? ?Referring physician: ?Zola Button, MD ?[address] ?[place],  [place] [address] ? ?HISTORICAL INFORMATION:  ? ?Selected notes from the [place] ?  ? ?Lab Results  ?Component Value Date  ? HGBA1C 6.5 (H) 05/25/2021  ?  ? ?CURRENT MEDICATIONS: ?Current Outpatient Medications (Ophthalmic Drugs)  ?Medication Sig  ? latanoprost (XALATAN) 0.005 % ophthalmic solution Place 1 drop into both eyes at bedtime.  ? ?No current facility-administered medications for this visit. (Ophthalmic Drugs)  ? ?Current Outpatient Medications (Other)  ?Medication Sig  ? acetaminophen (TYLENOL) 500 MG tablet Take 1,000 mg by mouth every 6 (six) hours as needed for headache (pain).   ? albuterol (VENTOLIN HFA) 108 (90 Base) MCG/ACT inhaler Inhale 2 puffs into the lungs every 6 (six) hours as needed for wheezing or shortness of breath.  ? aspirin EC 81 MG tablet Take 1 tablet (81 mg total) by mouth daily.  ? atorvastatin (LIPITOR) 40 MG tablet Take 1 tablet (40 mg total) by mouth daily.  ? ibuprofen (ADVIL,MOTRIN) 200 MG tablet Take 200 mg by mouth every 6 (six) hours as needed for headache (pain).  ? Multiple Vitamin (MULTIVITAMIN WITH MINERALS) TABS tablet Take 1 tablet by mouth at bedtime.  ? ?No current  facility-administered medications for this visit. (Other)  ? ? ? ? ?REVIEW OF SYSTEMS: ?ROS   ?Negative for: Constitutional, Gastrointestinal, Neurological, Skin, Genitourinary, Musculoskeletal, HENT, Endocrine, Cardiovascular, Eyes, Respiratory, Psychiatric, Allergic/Imm, Heme/Lymph ?Last edited by Hurman Horn, MD on 06/22/2021  2:35 PM.  ?  ? ? ? ?ALLERGIES ?Allergies  ?Allergen Reactions  ? Griseofulvin Rash  ? ? ?PAST MEDICAL HISTORY ?Past Medical History:  ?Diagnosis Date  ? Arthritis   ? Asthma 05/31/2016  ? Chest pain   ? Glaucoma, right eye 06/07/2012  ? Left thyroid nodule   ? Obesity (BMI 30.0-34.9) 05/31/2016  ? Pain   ? Saddle pulmonary embolus ([place]) 04/02/2017  ? Seizure-like activity ([place])   ? Shortness of breath   ? ?History reviewed. No pertinent surgical history. ? ?FAMILY HISTORY ?Family History  ?Problem Relation Age of Onset  ? Stroke Mother   ? Hypertension Mother   ? Heart disease Father   ? Kidney disease Brother   ? Hernia Son   ? ADD / ADHD Son   ? ? ?SOCIAL HISTORY ?Social History  ? ?Tobacco Use  ? Smoking status: Never  ? Smokeless tobacco: Never  ?Substance Use Topics  ? Alcohol use: No  ? Drug use: No  ? ?  ? ?  ? ?OPHTHALMIC EXAM: ? ?Base Eye Exam   ? ? Neuro/Psych   ? ? Oriented x3: Yes  ? Mood/Affect: Normal  ? ?  ?  ? ?  ? ?Slit Lamp and Fundus  Exam   ? ? External Exam   ? ?   Right Left  ? External Normal Normal  ? ?  ?  ? ? Slit Lamp Exam   ? ?   Right Left  ? Lids/Lashes Normal Normal  ? Conjunctiva/Sclera White and quiet White and quiet  ? Cornea Clear Clear  ? Anterior Chamber Deep and quiet Deep and quiet  ? Iris  Round and reactive  ? Lens Clear Clear  ? Anterior Vitreous Normal Normal  ? ?  ?  ? ? Fundus Exam   ? ?   Right Left  ? Posterior Vitreous Normal Normal  ? Disc  Normal  ? C/D Ratio  0.55  ? Macula  Subretinal neovascular membrane, with subretinal hemorrhage looks like polypoidal with nodular growths  ? Vessels  Normal  ? Periphery  Inferior apparent exudative RD,  chronic with exudates in the retina but also subretinal fluid slightly less extensive.  No retinal breaks noted  ? ?  ?  ? ?  ? ? ?IMAGING AND PROCEDURES  ?Imaging and Procedures for 06/22/21 ? ?OCT, Retina - OS - Left Eye   ? ?   ?Quality was good. Scan locations included subfoveal. Findings include abnormal foveal contour, choroidal neovascular membrane, cystoid macular edema.  ? ?Notes ?Massive subretinal fluid as well as intraretinal fluid and CME.  Chronic CNVM, post Eylea No. 1 continue to monitor further improvement ? ?  ? ?Intravitreal Injection, Pharmacologic Agent - OS - Left Eye   ? ?   ?Time Out ?06/22/2021. 2:36 PM. Confirmed correct patient, procedure, site, and patient consented.  ? ?Anesthesia ?Topical anesthesia was used. Anesthetic medications included Lidocaine 4%.  ? ?Procedure ?Preparation included 5% betadine to ocular surface, 10% betadine to eyelids, Ofloxacin , Tobramycin 0.3%. A 30 gauge needle was used.  ? ?Injection: ?2 mg aflibercept 2 MG/0.05ML ?  Route: Intravitreal, Site: Left Eye ?  [place]: [id number], Lot: [id number], Waste: 0 mL  ? ?Post-op ?Post injection exam found visual acuity of at least counting fingers. The patient tolerated the procedure well. There were no complications. The patient received written and verbal post procedure care education. Post injection medications included ocuflox.  ? ?Notes ?OS post intravitreal Avastin, improved macular anatomy repeat again today ? ?  ? ? ?  ?  ? ?  ?ASSESSMENT/PLAN: ? ?Exudative age-related macular degeneration of left eye with active choroidal neovascularization ([place]) ?OS today, stable acuity, post Eylea No. 1 for polypoidal form of wet AMD CNVM,  ? ?  ICD-10-CM   ?1. Exudative age-related macular degeneration of left eye with active choroidal neovascularization (HCC)  H35.3221 OCT, Retina - OS - Left Eye  ?  Intravitreal Injection, Pharmacologic Agent - OS - Left Eye  ?  aflibercept (EYLEA) SOLN 2 mg  ?  ? ? ?1.  OS, repeat injection  today for polypoidal form of exudative CNVM, wet AMD. ? ?2.  Injection Eylea No. 2 today ? ?3. ? ?Ophthalmic Meds Ordered this visit:  ?Meds ordered this encounter  ?Medications  ? aflibercept (EYLEA) SOLN 2 mg  ? ? ?  ? ?Return in about 5 weeks (around 07/27/2021) for dilate, OS, EYLEA OCT. ? ?There are no Patient Instructions on file for this visit. ? ? ?Explained the diagnoses, plan, and follow up with the patient and they expressed understanding.  Patient expressed understanding of the importance of proper follow up care.  ? ?Clent Demark. Peg Fifer M.D. ?Diseases & Surgery of the Retina and  Vitreous ?[place] ?06/22/21 ? ? ? ? ?Abbreviations: ?M myopia (nearsighted); A astigmatism; H hyperopia (farsighted); P presbyopia; Mrx spectacle prescription;  CTL contact lenses; OD right eye; OS left eye; OU both eyes  XT exotropia; ET esotropia; PEK punctate epithelial keratitis; PEE punctate epithelial erosions; DES dry eye syndrome; MGD meibomian gland dysfunction; ATs artificial tears; PFAT's preservative free artificial tears; [place] nuclear sclerotic cataract; PSC posterior subcapsular cataract; ERM epi-retinal membrane; PVD posterior vitreous detachment; RD retinal detachment; DM diabetes mellitus; DR diabetic retinopathy; NPDR non-proliferative diabetic retinopathy; PDR proliferative diabetic retinopathy; CSME clinically significant macular edema; DME diabetic macular edema; dbh dot blot hemorrhages; CWS cotton wool spot; POAG primary open angle glaucoma; C/D cup-to-disc ratio; HVF humphrey visual field; GVF goldmann visual field; OCT optical coherence tomography; IOP intraocular pressure; BRVO Branch retinal vein occlusion; CRVO central retinal vein occlusion; CRAO central retinal artery occlusion; BRAO branch retinal artery occlusion; RT retinal tear; SB scleral buckle; PPV pars plana vitrectomy; VH Vitreous hemorrhage; PRP panretinal laser photocoagulation; IVK intravitreal kenalog; VMT vitreomacular  traction; MH Macular hole;  NVD neovascularization of the disc; NVE neovascularization elsewhere; AREDS age related eye disease study; ARMD age related macular degeneration; POAG primary open angle glaucoma; EBMD epithelia

## 2021-06-22 NOTE — Assessment & Plan Note (Signed)
OS today, stable acuity, post Eylea No. 1 for polypoidal form of wet AMD CNVM, ?

## 2021-06-28 ENCOUNTER — Encounter (INDEPENDENT_AMBULATORY_CARE_PROVIDER_SITE_OTHER): Payer: Medicare Other | Admitting: Ophthalmology

## 2021-07-27 ENCOUNTER — Encounter (INDEPENDENT_AMBULATORY_CARE_PROVIDER_SITE_OTHER): Payer: Medicare Other | Admitting: Ophthalmology

## 2021-08-02 ENCOUNTER — Encounter (INDEPENDENT_AMBULATORY_CARE_PROVIDER_SITE_OTHER): Payer: Medicare Other | Admitting: Ophthalmology

## 2021-08-16 ENCOUNTER — Encounter (INDEPENDENT_AMBULATORY_CARE_PROVIDER_SITE_OTHER): Payer: Self-pay | Admitting: Ophthalmology

## 2021-08-16 ENCOUNTER — Ambulatory Visit (INDEPENDENT_AMBULATORY_CARE_PROVIDER_SITE_OTHER): Payer: Medicare Other | Admitting: Ophthalmology

## 2021-08-16 DIAGNOSIS — H353221 Exudative age-related macular degeneration, left eye, with active choroidal neovascularization: Secondary | ICD-10-CM | POA: Diagnosis not present

## 2021-08-16 MED ORDER — AFLIBERCEPT 2MG/0.05ML IZ SOLN FOR KALEIDOSCOPE
2.0000 mg | INTRAVITREAL | Status: AC | PRN
  Administered 2021-08-16: 2 mg via INTRAVITREAL

## 2021-08-16 NOTE — Progress Notes (Signed)
? ? ?08/16/2021 ? ?  ? ?CHIEF COMPLAINT ?Patient presents for  ?Chief Complaint  ?Patient presents with  ? Macular Degeneration  ? ? ?Occupation, with lesion subfoveal left massive, with resistant to Avastin in the past, on intravitreal Eylea since February 2023 OS ? ?HISTORY OF PRESENT ILLNESS: ?Barbara Wells is a 66 y.o. female who presents to the clinic today for:  ? ?HPI   ?7 weeks for DILATE, OS EYLEA, OCT (Moved to this date due to transportion.) ?Pt stated vision has improved since last visit. ?Pt states she sees FOL and floaters. ? ? ?Last edited by Angeline SlimMa, San on 08/16/2021 10:22 AM.  ?  ? ? ?Referring physician: ?Littie DeedsSun, Richard, MD ?[address] ?[place],  [place] [id number] ? ?HISTORICAL INFORMATION:  ? ?Selected notes from the MEDICAL RECORD NUMBER ?  ? ?Lab Results  ?Component Value Date  ? HGBA1C 6.5 (H) 05/25/2021  ?  ? ?CURRENT MEDICATIONS: ?Current Outpatient Medications (Ophthalmic Drugs)  ?Medication Sig  ? latanoprost (XALATAN) 0.005 % ophthalmic solution Place 1 drop into both eyes at bedtime.  ? ?No current facility-administered medications for this visit. (Ophthalmic Drugs)  ? ?Current Outpatient Medications (Other)  ?Medication Sig  ? acetaminophen (TYLENOL) 500 MG tablet Take 1,000 mg by mouth every 6 (six) hours as needed for headache (pain).   ? albuterol (VENTOLIN HFA) 108 (90 Base) MCG/ACT inhaler Inhale 2 puffs into the lungs every 6 (six) hours as needed for wheezing or shortness of breath.  ? aspirin EC 81 MG tablet Take 1 tablet (81 mg total) by mouth daily.  ? atorvastatin (LIPITOR) 40 MG tablet Take 1 tablet (40 mg total) by mouth daily.  ? ibuprofen (ADVIL,MOTRIN) 200 MG tablet Take 200 mg by mouth every 6 (six) hours as needed for headache (pain).  ? Multiple Vitamin (MULTIVITAMIN WITH MINERALS) TABS tablet Take 1 tablet by mouth at bedtime.  ? ?No current facility-administered medications for this visit. (Other)  ? ? ? ? ?REVIEW OF SYSTEMS: ?ROS   ?Negative for: Constitutional,  Gastrointestinal, Neurological, Skin, Genitourinary, Musculoskeletal, HENT, Endocrine, Cardiovascular, Eyes, Respiratory, Psychiatric, Allergic/Imm, Heme/Lymph ?Last edited by Angeline SlimMa, San on 08/16/2021 10:22 AM.  ?  ? ? ? ?ALLERGIES ?Allergies  ?Allergen Reactions  ? Griseofulvin Rash  ? ? ?PAST MEDICAL HISTORY ?Past Medical History:  ?Diagnosis Date  ? Arthritis   ? Asthma 05/31/2016  ? Chest pain   ? Glaucoma, right eye 06/07/2012  ? Left thyroid nodule   ? Obesity (BMI 30.0-34.9) 05/31/2016  ? Pain   ? 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 Seizure-like activity (HCC)   ? Shortness of breath   ? ?History reviewed. No pertinent surgical history. ? ?FAMILY HISTORY ?Family History  ?Problem Relation Age of Onset  ? Stroke Mother   ? Hypertension Mother   ? Heart disease Father   ? Kidney disease Brother   ? Hernia Son   ? ADD / ADHD Son   ? ? ?SOCIAL HISTORY ?Social History  ? ?Tobacco Use  ? Smoking status: Never  ? Smokeless tobacco: Never  ?Substance Use Topics  ? Alcohol use: No  ? Drug use: No  ? ?  ? ?  ? ?OPHTHALMIC EXAM: ? ?Base Eye Exam   ? ? Visual Acuity (ETDRS)   ? ?   Right Left  ? Dist cc  20/200  ? Dist ph cc  NI  ? ? Correction: Glasses  ? ?  ?  ? ? Tonometry (Tonopen, 10:29 AM)   ? ?  Right Left  ? Pressure 30 20  ? ?  ?  ? ? Pupils   ? ?   Pupils Dark Light Shape React APD  ? Right PERRL   Irregular  None  ? Left PERRL 6 5 Round Brisk None  ? ?  ?  ? ? Visual Fields   ? ?   Left Right  ?  Full   ? Restrictions  Total superior temporal, inferior temporal, superior nasal, inferior nasal deficiencies  ? ?  ?  ? ? Extraocular Movement   ? ?   Right Left  ?  Full Full  ? ?  ?  ? ? Neuro/Psych   ? ? Oriented x3: Yes  ? Mood/Affect: Normal  ? ?  ?  ? ? Dilation   ? ? Left eye: 2.5% Phenylephrine, 1.0% Mydriacyl @ 10:29 AM  ? ?  ?  ? ?  ? ?Slit Lamp and Fundus Exam   ? ? External Exam   ? ?   Right Left  ? External Normal Normal  ? ?  ?  ? ? Slit Lamp Exam   ? ?   Right Left  ? Lids/Lashes Normal Normal  ?  Conjunctiva/Sclera White and quiet White and quiet  ? Cornea Clear Clear  ? Anterior Chamber Deep and quiet Deep and quiet  ? Iris  Round and reactive  ? Lens Clear Clear  ? Anterior Vitreous Normal Normal  ? ?  ?  ? ? Fundus Exam   ? ?   Right Left  ? Posterior Vitreous Normal Central vitreous floaters, mild, Posterior vitreous detachment  ? Disc  Normal  ? C/D Ratio  0.55  ? Macula  Subretinal neovascular membrane, massive with subretinal hemorrhage, now white, fibrotic, looks like polypoidal with nodular growths  ? Vessels  Normal  ? Periphery  Inferior apparent exudative RD, chronic with exudates in the retina but also subretinal fluid slightly less extensive.  No retinal breaks noted  ? ?  ?  ? ?  ? ? ?IMAGING AND PROCEDURES  ?Imaging and Procedures for 08/16/21 ? ?OCT, Retina - OU - Both Eyes   ? ?   ?Left Eye ?Quality was good. Scan locations included subfoveal. Central Foveal Thickness: 834. Progression has worsened. Findings include abnormal foveal contour, subretinal hyper-reflective material, intraretinal hyper-reflective material, cystoid macular edema, choroidal neovascular membrane, disciform scar, epiretinal membrane, macular pucker, vitreous traction.  ? ?Notes ?Slightly less subretinal fluid and intraretinal fluid OS, post injection Eylea at 7-week interval.  Repeat injection today to maintain ? ?  ? ?Intravitreal Injection, Pharmacologic Agent - OS - Left Eye   ? ?   ?Time Out ?08/16/2021. 11:00 AM. Confirmed correct patient, procedure, site, and patient consented.  ? ?Anesthesia ?Topical anesthesia was used. Anesthetic medications included Lidocaine 4%.  ? ?Procedure ?Preparation included 5% betadine to ocular surface, 10% betadine to eyelids, Ofloxacin , Tobramycin 0.3%. A 30 gauge needle was used.  ? ?Injection: ?2 mg aflibercept 2 MG/0.05ML ?  Route: Intravitreal, Site: Left Eye ?  NDC: L6038910, Lot: [id number], Waste: 0 mL  ? ?Post-op ?Post injection exam found visual acuity of at least  counting fingers. The patient tolerated the procedure well. There were no complications. The patient received written and verbal post procedure care education. Post injection medications included ocuflox.  ? ?Notes ?OS post intravitreal Avastin, improved macular anatomy repeat again today ? ?  ? ? ?  ?  ? ?  ?ASSESSMENT/PLAN: ? ?Exudative age-related  macular degeneration of left eye with active choroidal neovascularization (HCC) ?Recent change from Avastin to [facility].  Repeat Eylea today to control massive subfoveal CNVM with nodular excrescences appears to be polypoidal type lesions.  ? ?  ICD-10-CM   ?1. Exudative age-related macular degeneration of left eye with active choroidal neovascularization (HCC)  H35.3221 OCT, Retina - OU - Both Eyes  ?  Intravitreal Injection, Pharmacologic Agent - OS - Left Eye  ?  aflibercept (EYLEA) SOLN 2 mg  ?  ? ? ?1.  Monocular patient with wet AMD OS aggressive therapy required currently at 7-week follow-up interval.  Repeat injection today of Eylea and follow-up next in 5 weeks ? ?2.  [place] polypoidal type lesion.  Repeat Eylea today ? ?3. ? ?Ophthalmic Meds Ordered this visit:  ?Meds ordered this encounter  ?Medications  ? aflibercept (EYLEA) SOLN 2 mg  ? ? ?  ? ?Return in about 5 weeks (around 09/20/2021) for dilate, OS, EYLEA OCT. ? ?There are no Patient Instructions on file for this visit. ? ? ?Explained the diagnoses, plan, and follow up with the patient and they expressed understanding.  Patient expressed understanding of the importance of proper follow up care.  ? ?Alford Highland. Raylea Adcox M.D. ?Diseases & Surgery of the Retina and Vitreous ?[facility] ?08/16/21 ? ? ? ? ?Abbreviations: ?M myopia (nearsighted); A astigmatism; H hyperopia (farsighted); P presbyopia; Mrx spectacle prescription;  CTL contact lenses; OD right eye; OS left eye; OU both eyes  XT exotropia; ET esotropia; PEK punctate epithelial keratitis; PEE punctate epithelial erosions; DES dry eye  syndrome; MGD meibomian gland dysfunction; ATs artificial tears; PFAT's preservative free artificial tears; NSC nuclear sclerotic cataract; PSC posterior subcapsular cataract; ERM epi-retinal membrane; PVD posterior vitre

## 2021-08-16 NOTE — Assessment & Plan Note (Signed)
Recent change from Avastin to [facility].  Repeat Eylea today to control massive subfoveal CNVM with nodular excrescences appears to be polypoidal type lesions. ?

## 2021-08-23 NOTE — Progress Notes (Signed)
    SUBJECTIVE:   CHIEF COMPLAINT / HPI:  No chief complaint on file.   Noted to have mildly elevated A1c 6.5 at last visit. Patient states she has been watching what she has been eating and exercising more. Reducing carb intake. She has lost over 20 pounds since last visit. Cycling and walking at least 5 days a week.  May go to [facility] soon to visit son.  PERTINENT  PMH / PSH: left thyroid nodule, PE, HLD, CAD, macular degeneration  Patient Care Team: Littie Deeds, MD as PCP - General (Family Medicine) Corky Crafts, MD as PCP - Cardiology (Cardiology)   OBJECTIVE:   BP (!) 116/56   Pulse 61   Ht 5\' 5"  (1.651 m)   Wt 199 lb 2 oz (90.3 kg)   SpO2 99%   BMI 33.14 kg/m   Physical Exam Constitutional:      General: She is not in acute distress. Cardiovascular:     Rate and Rhythm: Normal rate and regular rhythm.  Pulmonary:     Effort: Pulmonary effort is normal. No respiratory distress.     Breath sounds: Normal breath sounds.  Musculoskeletal:     Cervical back: Neck supple.  Neurological:     Mental Status: She is alert.        08/25/2021    2:33 PM  Depression screen PHQ 2/9  Decreased Interest 0  Down, Depressed, Hopeless 0  PHQ - 2 Score 0  Altered sleeping 1  Tired, decreased energy 0  Change in appetite 0  Feeling bad or failure about yourself  0  Trouble concentrating 0  Moving slowly or fidgety/restless 0  Suicidal thoughts 0  PHQ-9 Score 1     Wt Readings from Last 3 Encounters:  08/25/21 199 lb 2 oz (90.3 kg)  05/25/21 227 lb 6.4 oz (103.1 kg)  08/17/20 222 lb 3.2 oz (100.8 kg)        ASSESSMENT/PLAN:   Prediabetes Borderline elevated A1c at last visit.  Recheck A1c today, suspect will be improved with weight loss and dietary changes.   HCM - Cologuard ordered at last visit; patient states she collected the sample but never mailed it in because she did not think the sample would be accurate.  She does not want to repeat this  test as she has difficulty collecting the sample due to her poor vision.  Continues to decline colonoscopy.  Return in about 1 year (around 08/26/2022) for physical.   08/28/2022, MD [facility]

## 2021-08-23 NOTE — Patient Instructions (Incomplete)
It was nice seeing you today!  Great work with the weight loss!  Follow-up in 1 year for your next physical or sooner if needed.  Stay well, Barbara Deeds, MD [facility] [phone]  --  Make sure to check out at the front desk before you leave today.  Please arrive at least 15 minutes prior to your scheduled appointments.  If you had blood work today, I will send you a MyChart message or a letter if results are normal. Otherwise, I will give you a call.  If you had a referral placed, they will call you to set up an appointment. Please give [place] a call if you don't hear back in the next 2 weeks.  If you need additional refills before your next appointment, please call your pharmacy first.

## 2021-08-25 ENCOUNTER — Ambulatory Visit (INDEPENDENT_AMBULATORY_CARE_PROVIDER_SITE_OTHER): Payer: Medicare Other | Admitting: Family Medicine

## 2021-08-25 ENCOUNTER — Encounter: Payer: Self-pay | Admitting: Family Medicine

## 2021-08-25 VITALS — BP 116/56 | HR 61 | Ht 65.0 in | Wt 199.1 lb

## 2021-08-25 DIAGNOSIS — R7303 Prediabetes: Secondary | ICD-10-CM | POA: Diagnosis not present

## 2021-08-25 DIAGNOSIS — R7309 Other abnormal glucose: Secondary | ICD-10-CM | POA: Diagnosis not present

## 2021-08-26 DIAGNOSIS — R7303 Prediabetes: Secondary | ICD-10-CM | POA: Insufficient documentation

## 2021-08-26 LAB — HEMOGLOBIN A1C
Est. average glucose Bld gHb Est-mCnc: 126 mg/dL
Hgb A1c MFr Bld: 6 % — ABNORMAL HIGH (ref 4.8–5.6)

## 2021-08-26 NOTE — Assessment & Plan Note (Addendum)
Borderline elevated A1c at last visit.  Recheck A1c today, suspect will be improved with weight loss and dietary changes.

## 2021-09-07 ENCOUNTER — Encounter: Payer: Self-pay | Admitting: *Deleted

## 2021-09-21 ENCOUNTER — Encounter (INDEPENDENT_AMBULATORY_CARE_PROVIDER_SITE_OTHER): Payer: Medicare Other | Admitting: Ophthalmology

## 2021-09-27 ENCOUNTER — Ambulatory Visit (INDEPENDENT_AMBULATORY_CARE_PROVIDER_SITE_OTHER): Payer: Medicare Other | Admitting: Ophthalmology

## 2021-09-27 ENCOUNTER — Encounter (INDEPENDENT_AMBULATORY_CARE_PROVIDER_SITE_OTHER): Payer: Self-pay | Admitting: Ophthalmology

## 2021-09-27 DIAGNOSIS — H353221 Exudative age-related macular degeneration, left eye, with active choroidal neovascularization: Secondary | ICD-10-CM

## 2021-09-27 DIAGNOSIS — H318 Other specified disorders of choroid: Secondary | ICD-10-CM | POA: Diagnosis not present

## 2021-09-27 DIAGNOSIS — H3322 Serous retinal detachment, left eye: Secondary | ICD-10-CM | POA: Diagnosis not present

## 2021-09-27 DIAGNOSIS — H401121 Primary open-angle glaucoma, left eye, mild stage: Secondary | ICD-10-CM | POA: Insufficient documentation

## 2021-09-27 DIAGNOSIS — H2512 Age-related nuclear cataract, left eye: Secondary | ICD-10-CM

## 2021-09-27 MED ORDER — AFLIBERCEPT 2MG/0.05ML IZ SOLN FOR KALEIDOSCOPE
2.0000 mg | INTRAVITREAL | Status: AC | PRN
  Administered 2021-09-27: 2 mg via INTRAVITREAL

## 2021-09-27 NOTE — Assessment & Plan Note (Signed)
Component of wet AMD improving

## 2021-09-27 NOTE — Assessment & Plan Note (Signed)
Subfoveal fibrotic lesions appearing

## 2021-10-05 NOTE — Progress Notes (Unsigned)
Cardiology Office Note   Date:  10/06/2021   ID:  Barbara Wells, DOB 1955-04-18, MRN [id number]  PCP:  Littie Deeds, MD    No chief complaint on file.    Wt Readings from Last 3 Encounters:  10/06/21 191 lb (86.6 kg)  08/25/21 199 lb 2 oz (90.3 kg)  05/25/21 227 lb 6.4 oz (103.1 kg)       History of Present Illness: Barbara Wells is a 66 y.o. female  who had a submassive PE in 12/18.  She presented with syncope.  She was given a SL NTG.  She had right heart strain.  She took anticoagulation for about a year.  No cause of PE documented.   She was seen for coronary artery calcification.   2018 echo showed: Left ventricle: The cavity size was normal. Systolic function was   normal. The estimated ejection fraction was in the range of 55%   to 60%. Wall motion was normal; there were no regional wall   motion abnormalities. Doppler parameters are consistent with   abnormal left ventricular relaxation (grade 1 diastolic   dysfunction). There was no evidence of elevated ventricular   filling pressure by Doppler parameters. - Aortic valve: There was no regurgitation. - Mitral valve: There was trivial regurgitation. - Right ventricle: The cavity size was severely dilated. Wall   thickness was normal. Systolic function was moderately reduced. - Right atrium: The atrium was normal in size. - Tricuspid valve: There was moderate regurgitation. - Pulmonic valve: There was trivial regurgitation. - Pulmonary arteries: Systolic pressure was mildly to moderately   increased. PA peak pressure: 41 mm Hg (S). - Inferior vena cava: The vessel was dilated. The respirophasic   diameter changes were blunted (< 50%), consistent with elevated   central venous pressure. - Pericardium, extracardiac: There was no pericardial effusion.   SHe has a thyroid mass as well and this is being followed.    In 2022: "No bleeding issues.  She reports cramps and cold feet on occasion.  She cycles  4x/week for 30-60 minutes."  Denies :  Leg edema. Nitroglycerin use. Orthopnea. Palpitations. Paroxysmal nocturnal dyspnea. Shortness of breath. Syncope.    Reports rare left chest pain when sitting, lasting seconds to a minute, worse when she uses very hot water in the shower.  SHe continues to exercise on the stationary bike.  5 days a week for 30 minutes at a time.  Never had to stop exercise due to chest pain or shortness of breath.  Feel better with exercise compared to just sitting.  She also walks regularly.  3-4 times a week for 30 minutes at a time.  No cardiac sx.  Has not had to stop exercise.    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   Current Outpatient Medications  Medication Sig Dispense Refill   acetaminophen (TYLENOL) 500 MG tablet Take 1,000 mg by mouth every 6 (six) hours as needed for headache (pain).      albuterol (VENTOLIN HFA) 108 (90 Base) MCG/ACT inhaler Inhale 2 puffs into the lungs every 6 (six) hours as needed for wheezing  or shortness of breath. 1 each 0   aspirin EC 81 MG tablet Take 1 tablet (81 mg total) by mouth daily. 90 tablet 3   atorvastatin (LIPITOR) 40 MG tablet Take 1 tablet (40 mg total) by mouth daily. 90 tablet 3   ibuprofen (ADVIL,MOTRIN) 200 MG tablet Take 200 mg by mouth every 6 (six) hours as needed for headache (pain).     latanoprost (XALATAN) 0.005 % ophthalmic solution Place 1 drop into both eyes at bedtime. 2.5 mL 1   Multiple Vitamin (MULTIVITAMIN WITH MINERALS) TABS tablet Take 1 tablet by mouth at bedtime.     No current facility-administered medications for this visit.    Allergies:   Griseofulvin    Social History:  The patient  reports that  she has never smoked. She has never used smokeless tobacco. She reports that she does not drink alcohol and does not use drugs.   Family History:  The patient's family history includes ADD / ADHD in her son; Heart disease in her father; Hernia in her son; Hypertension in her mother; Kidney disease in her brother; Stroke in her mother.    ROS:  Please see the history of present illness.   Otherwise, review of systems are positive for .   All other systems are reviewed and negative.    PHYSICAL EXAM: VS:  BP 110/60   Pulse 72   Ht 5\' 6"  (1.676 m)   Wt 191 lb (86.6 kg)   SpO2 99%   BMI 30.83 kg/m  , BMI Body mass index is 30.83 kg/m. GEN: Well nourished, well developed, in no acute distress HEENT: normal Neck: no JVD, carotid bruits, or masses Cardiac: RRR; no murmurs, rubs, or gallops,no edema  Respiratory:  clear to auscultation bilaterally, normal work of breathing GI: soft, nontender, nondistended, + BS MS: no deformity or atrophy Skin: warm and dry, no rash Neuro:  Strength and sensation are intact Psych: euthymic mood, full affect   EKG:   The ekg ordered today demonstrates NSR, no ST changes   Recent Labs: 05/25/2021: BUN 11; Creatinine, Ser 0.68; Hemoglobin 12.6; Platelets 225; Potassium 4.8; Sodium 144   Lipid Panel    Component Value Date/Time   CHOL 114 05/25/2021 1548   TRIG 234 (H) 05/25/2021 1548   HDL 30 (L) 05/25/2021 1548   CHOLHDL 3.8 05/25/2021 1548   CHOLHDL 4.6 04/02/2017 2339   VLDL 13 04/02/2017 2339   LDLCALC 47 05/25/2021 1548     Other studies Reviewed: Additional studies/ records that were reviewed today with results demonstrating: labs reviewed.   ASSESSMENT AND PLAN:  Coronary artery calcification: Atypical chest pain- rare.  No angina with regular exertion.  Normal ECG.  Prior pulmomary embolism: Now just on aspirin.  Prior abnormal echo: RV dysfunction in the setting of PE.  Prediabetes: Continue to be active and exercise regularly.   Whole food plant, based diet.  High-fiber diet.  Avoid processed foods. Leg cramps: Stretching, particularly at night may be helpful. Hyperlipidemia: LDL 47.  Continue atorvastatin along with a healthy diet and regular exercise.   Current medicines are reviewed at length with the patient today.  The patient concerns regarding her medicines were addressed.  The following changes have been made:  No change  Labs/ tests ordered today include:  No orders of the defined types were placed in this encounter.   Recommend 150 minutes/week of aerobic exercise Low fat, low carb, high fiber diet recommended  Disposition:   FU in 1  year   Signed, Lance Muss, MD  10/06/2021 10:57 AM    [facility] [address] [place], [place], [place]  [address] Phone: [phone]; Fax: [phone]

## 2021-10-06 ENCOUNTER — Encounter: Payer: Self-pay | Admitting: Interventional Cardiology

## 2021-10-06 ENCOUNTER — Ambulatory Visit: Payer: Medicare Other | Admitting: Interventional Cardiology

## 2021-10-06 VITALS — BP 110/60 | HR 72 | Ht 66.0 in | Wt 191.0 lb

## 2021-10-06 DIAGNOSIS — I2699 Other pulmonary embolism without acute cor pulmonale: Secondary | ICD-10-CM | POA: Diagnosis not present

## 2021-10-06 DIAGNOSIS — R7303 Prediabetes: Secondary | ICD-10-CM

## 2021-10-06 DIAGNOSIS — I2510 Atherosclerotic heart disease of native coronary artery without angina pectoris: Secondary | ICD-10-CM | POA: Diagnosis not present

## 2021-10-06 NOTE — Patient Instructions (Signed)
Medication Instructions:  Your physician recommends that you continue on your current medications as directed. Please refer to the Current Medication list given to you today.  *If you need a refill on your cardiac medications before your next appointment, please call your pharmacy*  Lab Work: If you have labs (blood work) drawn today and your tests are completely normal, you will receive your results only by: MyChart Message (if you have MyChart) OR A paper copy in the mail If you have any lab test that is abnormal or we need to change your treatment, we will call you to review the results.  Testing/Procedures: None ordered today.  Follow-Up: At [facility], you and your health needs are our priority.  As part of our continuing mission to provide you with exceptional heart care, we have created designated Provider Care Teams.  These Care Teams include your primary Cardiologist (physician) and Advanced Practice Providers (APPs -  Physician Assistants and Nurse Practitioners) who all work together to provide you with the care you need, when you need it.  We recommend signing up for the patient portal called "MyChart".  Sign up information is provided on this After Visit Summary.  MyChart is used to connect with patients for Virtual Visits (Telemedicine).  Patients are able to view lab/test results, encounter notes, upcoming appointments, etc.  Non-urgent messages can be sent to your provider as well.   To learn more about what you can do with MyChart, go to [email or web address].    Your next appointment:   1 year(s)  The format for your next appointment:   In Person  Provider:   Jayadeep Varanasi, MD {   Important Information About Sugar       

## 2021-11-08 ENCOUNTER — Encounter (INDEPENDENT_AMBULATORY_CARE_PROVIDER_SITE_OTHER): Payer: Medicare Other | Admitting: Ophthalmology

## 2021-11-09 ENCOUNTER — Encounter (INDEPENDENT_AMBULATORY_CARE_PROVIDER_SITE_OTHER): Payer: Self-pay | Admitting: Ophthalmology

## 2021-11-09 ENCOUNTER — Ambulatory Visit (INDEPENDENT_AMBULATORY_CARE_PROVIDER_SITE_OTHER): Payer: Medicare Other | Admitting: Ophthalmology

## 2021-11-09 DIAGNOSIS — H318 Other specified disorders of choroid: Secondary | ICD-10-CM | POA: Diagnosis not present

## 2021-11-09 DIAGNOSIS — H353221 Exudative age-related macular degeneration, left eye, with active choroidal neovascularization: Secondary | ICD-10-CM

## 2021-11-09 MED ORDER — AFLIBERCEPT 2MG/0.05ML IZ SOLN FOR KALEIDOSCOPE
2.0000 mg | INTRAVITREAL | Status: AC | PRN
  Administered 2021-11-09: 2 mg via INTRAVITREAL

## 2021-11-09 NOTE — Progress Notes (Addendum)
11/09/2021     CHIEF COMPLAINT Patient presents for  Chief Complaint  Patient presents with   Macular Degeneration      HISTORY OF PRESENT ILLNESS: Barbara Wells is a 66 y.o. female who presents to the clinic today for:   HPI     6 weeks dilate OS Eylea OCT Pt states her vision has been stable Pt denies any new floaters but is having FOL   Last edited by Edmon Crape, MD on 11/09/2021  9:56 AM.      Referring physician: Littie Deeds, MD [address] [place],  [place] [address]  HISTORICAL INFORMATION:   Selected notes from the MEDICAL RECORD NUMBER    Lab Results  Component Value Date   HGBA1C 6.0 (H) 08/25/2021     CURRENT MEDICATIONS: Current Outpatient Medications (Ophthalmic Drugs)  Medication Sig   latanoprost (XALATAN) 0.005 % ophthalmic solution Place 1 drop into both eyes at bedtime.   No current facility-administered medications for this visit. (Ophthalmic Drugs)   Current Outpatient Medications (Other)  Medication Sig   acetaminophen (TYLENOL) 500 MG tablet Take 1,000 mg by mouth every 6 (six) hours as needed for headache (pain).    albuterol (VENTOLIN HFA) 108 (90 Base) MCG/ACT inhaler Inhale 2 puffs into the lungs every 6 (six) hours as needed for wheezing or shortness of breath.   aspirin EC 81 MG tablet Take 1 tablet (81 mg total) by mouth daily.   atorvastatin (LIPITOR) 40 MG tablet Take 1 tablet (40 mg total) by mouth daily.   ibuprofen (ADVIL,MOTRIN) 200 MG tablet Take 200 mg by mouth every 6 (six) hours as needed for headache (pain).   Multiple Vitamin (MULTIVITAMIN WITH MINERALS) TABS tablet Take 1 tablet by mouth at bedtime.   No current facility-administered medications for this visit. (Other)      REVIEW OF SYSTEMS: ROS   Negative for: Constitutional, Gastrointestinal, Neurological, Skin, Genitourinary, Musculoskeletal, HENT, Endocrine, Cardiovascular, Eyes, Respiratory, Psychiatric, Allergic/Imm, Heme/Lymph Last edited by  Edmon Crape, MD on 11/09/2021  9:59 AM.       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  FAMILY HISTORY Family History  Problem Relation Age of Onset   Stroke Mother    Hypertension Mother    Heart disease Father    Kidney disease Brother    Hernia Son    ADD / ADHD Son     SOCIAL HISTORY Social History   Tobacco Use   Smoking status: Never   Smokeless tobacco: Never  Substance Use Topics   Alcohol use: No   Drug use: No         OPHTHALMIC EXAM:  Base Eye Exam     Visual Acuity (ETDRS)       Right Left   Dist [place] 20/250         Tonometry (Tonopen, 9:24 AM)       Right Left   Pressure 20 24         Neuro/Psych     Oriented x3: Yes  Mood/Affect: Normal         Dilation     Left eye: 2.5% Phenylephrine, 1.0% Mydriacyl @ 9:18 AM           Slit Lamp and Fundus Exam     External Exam       Right Left   External Normal Normal         Slit Lamp Exam       Right Left   Lids/Lashes Normal Normal   Conjunctiva/Sclera White and quiet White and quiet   Cornea Clear Clear   Anterior Chamber Deep and quiet Deep and quiet   Iris  Round and reactive   Lens Clear 2+ Nuclear sclerosis   Anterior Vitreous Normal Normal         Fundus Exam       Right Left   Posterior Vitreous Normal Central vitreous floaters, mild, Posterior vitreous detachment   Disc  Normal   C/D Ratio  0.55   Macula  Subretinal neovascular membrane, now white, fibrotic, lesions looks like polypoidal with nodular growths with reddish hue, apparently involutional now   Vessels  Normal    Periphery  Inferior apparent exudative RD, chronic with exudates in the retina but also subretinal fluid slightly less extensive.  No retinal breaks noted            IMAGING AND PROCEDURES  Imaging and Procedures for 11/09/21  OCT, Retina - OU - Both Eyes       Left Eye Quality was borderline. Scan locations included juxtafoveal. Progression has improved. Findings include abnormal foveal contour.   Notes OS with large disciform but with less intraretinal fluid today continue with therapy today to maintain to prevent scotoma enlargement      Intravitreal Injection, Pharmacologic Agent - OS - Left Eye       Time Out 11/09/2021. 9:58 AM. Confirmed correct patient, procedure, site, and patient consented.   Anesthesia Topical anesthesia was used. Anesthetic medications included Lidocaine 4%.   Procedure Preparation included 5% betadine to ocular surface, 10% betadine to eyelids, Tobramycin 0.3%, Ofloxacin . A 30 gauge needle was used.   Injection: 2 mg aflibercept 2 MG/0.05ML   Route: Intravitreal, Site: Left Eye   NDC: [id number], Lot: [id number], Expiration date: 12/05/2022, Waste: 0 mL   Post-op Post injection exam found visual acuity of at least counting fingers. The patient tolerated the procedure well. There were no complications. The patient received written and verbal post procedure care education. Post injection medications included ocuflox.   Notes OS post intravitreal Eylea improved macular anatomy repeat again today              ASSESSMENT/PLAN:  Exudative age-related macular degeneration of left eye with active choroidal neovascularization (HCC) Improving macular findings yet still very active with nodular polypoid type lesions in the macular region visibly clinically in addition to OCT findings.  Repeat Eylea OS today to control scotoma growth with slight improvement in vision is noted  Idiopathic polypoidal choroidal vasculopathy Polyps are seen  clinically in the macular region     ICD-10-CM   1. Exudative age-related macular degeneration of left eye with active choroidal neovascularization (HCC)  H35.3221 OCT, Retina - OU - Both Eyes    Intravitreal Injection, Pharmacologic Agent - OS - Left Eye    aflibercept (EYLEA) SOLN 2 mg    2. 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  Repeat intravitreal Eylea OS today reevaluate again in 5  weeks to determine effectiveness of Eylea at shorter intervals  2.  3.  Ophthalmic Meds Ordered this visit:  Meds ordered this encounter  Medications   aflibercept (EYLEA) SOLN 2 mg       Return in about 5 weeks (around 12/14/2021) for dilate, OS, EYLEA OCT.  There are no Patient Instructions on file for this visit.   Explained the diagnoses, plan, and follow up with the patient and they expressed understanding.  Patient expressed understanding of the importance of proper follow up care.   Alford Highland Byrl Latin M.D. Diseases & Surgery of the Retina and Vitreous [facility] 11/09/21     Abbreviations: M myopia (nearsighted); A astigmatism; H hyperopia (farsighted); P presbyopia; Mrx spectacle prescription;  CTL contact lenses; OD right eye; OS left eye; OU both eyes  XT exotropia; ET esotropia; PEK punctate epithelial keratitis; PEE punctate epithelial erosions; DES dry eye syndrome; MGD meibomian gland dysfunction; ATs artificial tears; PFAT's preservative free artificial tears; NSC nuclear sclerotic cataract; PSC posterior subcapsular cataract; ERM epi-retinal membrane; PVD posterior vitreous detachment; RD retinal detachment; DM diabetes mellitus; DR diabetic retinopathy; NPDR non-proliferative diabetic retinopathy; PDR proliferative diabetic retinopathy; CSME clinically significant macular edema; DME diabetic macular edema; dbh dot blot hemorrhages; CWS cotton wool spot; POAG primary open angle glaucoma; C/D cup-to-disc ratio; HVF humphrey visual field; GVF  goldmann visual field; OCT optical coherence tomography; IOP intraocular pressure; BRVO Branch retinal vein occlusion; CRVO central retinal vein occlusion; CRAO central retinal artery occlusion; BRAO branch retinal artery occlusion; RT retinal tear; SB scleral buckle; PPV pars plana vitrectomy; VH Vitreous hemorrhage; PRP panretinal laser photocoagulation; IVK intravitreal kenalog; VMT vitreomacular traction; MH Macular hole;  NVD neovascularization of the disc; NVE neovascularization elsewhere; AREDS age related eye disease study; ARMD age related macular degeneration; POAG primary open angle glaucoma; EBMD epithelial/anterior basement membrane dystrophy; ACIOL anterior chamber intraocular lens; IOL intraocular lens; PCIOL posterior chamber intraocular lens; Phaco/IOL phacoemulsification with intraocular lens placement; PRK photorefractive keratectomy; LASIK laser assisted in situ keratomileusis; HTN hypertension; DM diabetes mellitus; COPD chronic obstructive pulmonary disease

## 2021-11-09 NOTE — Assessment & Plan Note (Signed)
Polyps are seen clinically in the macular region

## 2021-11-09 NOTE — Assessment & Plan Note (Signed)
Improving macular findings yet still very active with nodular polypoid type lesions in the macular region visibly clinically in addition to OCT findings.  Repeat Eylea OS today to control scotoma growth with slight improvement in vision is noted

## 2021-11-15 ENCOUNTER — Encounter (INDEPENDENT_AMBULATORY_CARE_PROVIDER_SITE_OTHER): Payer: Medicare Other | Admitting: Ophthalmology

## 2021-12-14 ENCOUNTER — Encounter (INDEPENDENT_AMBULATORY_CARE_PROVIDER_SITE_OTHER): Payer: Medicare Other | Admitting: Ophthalmology

## 2021-12-15 ENCOUNTER — Ambulatory Visit (INDEPENDENT_AMBULATORY_CARE_PROVIDER_SITE_OTHER): Payer: Medicare Other | Admitting: Ophthalmology

## 2021-12-15 ENCOUNTER — Encounter (INDEPENDENT_AMBULATORY_CARE_PROVIDER_SITE_OTHER): Payer: Self-pay | Admitting: Ophthalmology

## 2021-12-15 DIAGNOSIS — H401121 Primary open-angle glaucoma, left eye, mild stage: Secondary | ICD-10-CM | POA: Diagnosis not present

## 2021-12-15 DIAGNOSIS — H318 Other specified disorders of choroid: Secondary | ICD-10-CM

## 2021-12-15 DIAGNOSIS — H2512 Age-related nuclear cataract, left eye: Secondary | ICD-10-CM | POA: Diagnosis not present

## 2021-12-15 DIAGNOSIS — H353221 Exudative age-related macular degeneration, left eye, with active choroidal neovascularization: Secondary | ICD-10-CM | POA: Diagnosis not present

## 2021-12-15 MED ORDER — AFLIBERCEPT 2MG/0.05ML IZ SOLN FOR KALEIDOSCOPE
2.0000 mg | INTRAVITREAL | Status: AC | PRN
  Administered 2021-12-15: 2 mg via INTRAVITREAL

## 2021-12-15 NOTE — Progress Notes (Signed)
12/15/2021     CHIEF COMPLAINT Patient presents for  Chief Complaint  Patient presents with   Macular Degeneration      HISTORY OF PRESENT ILLNESS: Barbara Wells is a 66 y.o. female who presents to the clinic today for:   HPI   5 weeks for DILATE OS EYLEA, OCT. Pt stated vision has remained stable however pt has noticed vision is gradually improving.  Last edited by Angeline Slim on 12/15/2021  9:44 AM.      Referring physician: Littie Deeds, MD [address] [place],  [place] [address]  HISTORICAL INFORMATION:   Selected notes from the MEDICAL RECORD NUMBER    Lab Results  Component Value Date   HGBA1C 6.0 (H) 08/25/2021     CURRENT MEDICATIONS: Current Outpatient Medications (Ophthalmic Drugs)  Medication Sig   latanoprost (XALATAN) 0.005 % ophthalmic solution Place 1 drop into both eyes at bedtime.   No current facility-administered medications for this visit. (Ophthalmic Drugs)   Current Outpatient Medications (Other)  Medication Sig   acetaminophen (TYLENOL) 500 MG tablet Take 1,000 mg by mouth every 6 (six) hours as needed for headache (pain).    albuterol (VENTOLIN HFA) 108 (90 Base) MCG/ACT inhaler Inhale 2 puffs into the lungs every 6 (six) hours as needed for wheezing or shortness of breath.   aspirin EC 81 MG tablet Take 1 tablet (81 mg total) by mouth daily.   atorvastatin (LIPITOR) 40 MG tablet Take 1 tablet (40 mg total) by mouth daily.   ibuprofen (ADVIL,MOTRIN) 200 MG tablet Take 200 mg by mouth every 6 (six) hours as needed for headache (pain).   Multiple Vitamin (MULTIVITAMIN WITH MINERALS) TABS tablet Take 1 tablet by mouth at bedtime.   No current facility-administered medications for this visit. (Other)      REVIEW OF SYSTEMS: ROS   Negative for: Constitutional, Gastrointestinal, Neurological, Skin, Genitourinary, Musculoskeletal, HENT, Endocrine, Cardiovascular, Eyes, Respiratory, Psychiatric, Allergic/Imm, Heme/Lymph Last edited by Angeline Slim on 12/15/2021  9:44 AM.       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 No pertinent surgical history.  FAMILY HISTORY Family History  Problem Relation Age of Onset   Stroke Mother    Hypertension Mother    Heart disease Father    Kidney disease Brother    Hernia Son    ADD / ADHD Son     SOCIAL HISTORY Social History   Tobacco Use   Smoking status: Never   Smokeless tobacco: Never  Substance Use Topics   Alcohol use: No   Drug use: No         OPHTHALMIC EXAM:  Base Eye Exam     Visual Acuity (ETDRS)       Right Left   Dist [place] 20/400   Dist ph [place]  20/200 -2    Correction: Glasses         Tonometry (Tonopen, 9:51 AM)       Right Left   Pressure 29 18         Pupils  Pupils APD   Right PERRL None   Left PERRL None         Visual Fields       Left Right   Restrictions  Total superior temporal, inferior temporal, superior nasal, inferior nasal deficiencies         Extraocular Movement       Right Left    Full, Ortho Full, Ortho         Neuro/Psych     Oriented x3: Yes   Mood/Affect: Normal         Dilation     Left eye: 2.5% Phenylephrine, 1.0% Mydriacyl @ 9:51 AM           Slit Lamp and Fundus Exam     External Exam       Right Left   External Normal Normal         Slit Lamp Exam       Right Left   Lids/Lashes Normal Normal   Conjunctiva/Sclera White and quiet White and quiet   Cornea Clear Clear   Anterior Chamber Deep and quiet Deep and quiet   Iris  Round and reactive   Lens Clear 2+ Nuclear sclerosis   Anterior Vitreous Normal Normal          Fundus Exam       Right Left   Posterior Vitreous Normal Central vitreous floaters, mild, Posterior vitreous detachment   Disc  Normal   C/D Ratio  0.65   Macula  Subretinal neovascular membrane, now white, fibrotic, lesions looks like polypoidal with nodular growths with reddish hue, apparently involutional now   Vessels  Normal   Periphery  Inferior apparent exudative RD, chronic with exudates in the retina but also subretinal fluid slightly less extensive.  No retinal breaks noted            IMAGING AND PROCEDURES  Imaging and Procedures for 12/15/21  OCT, Retina - OU - Both Eyes       Left Eye Quality was borderline. Scan locations included nasal. Progression has improved. Findings include abnormal foveal contour.   Notes OS with large disciform but with less intraretinal fluid today continue with therapy today to maintain to prevent scotoma enlargement      Intravitreal Injection, Pharmacologic Agent - OS - Left Eye       Time Out 12/15/2021. 10:52 AM. Confirmed correct patient, procedure, site, and patient consented.   Anesthesia Topical anesthesia was used. Anesthetic medications included Lidocaine 4%.   Procedure Preparation included 5% betadine to ocular surface, 10% betadine to eyelids, Tobramycin 0.3%, Ofloxacin . A 30 gauge needle was used.   Injection: 2 mg aflibercept 2 MG/0.05ML   Route: Intravitreal, Site: Left Eye   NDC: [id number], Lot: [id number], Expiration date: 01/03/2023, Waste: 0 mL   Post-op Post injection exam found visual acuity of at least counting fingers. The patient tolerated the procedure well. There were no complications. The patient received written and verbal post procedure care education. Post injection medications included ocuflox.   Notes OS post intravitreal Eylea improved macular anatomy repeat again today              ASSESSMENT/PLAN:  Exudative age-related macular degeneration of left eye with  active choroidal neovascularization (HCC) OS controlled scotoma, now loaded fully with Eylea.  Repeat injection today reevaluate next in 8 to 9 weeks.  Goal is scotoma control.  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  Nuclear sclerotic cataract of left eye  Not progressive at this date  Primary open-angle glaucoma, left eye, mild stage Stable over time     ICD-10-CM   1. Exudative age-related macular degeneration of left eye with active choroidal neovascularization (HCC)  H35.3221 OCT, Retina - OU - Both Eyes    Intravitreal Injection, Pharmacologic Agent - OS - Left Eye    aflibercept (EYLEA) SOLN 2 mg    2. 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 Nuclear sclerotic cataract of left eye  H25.12     4. Primary open-angle glaucoma, left eye, mild stage  H40.1121       1.  OS, with subfoveal disciform scar from idiopathic polypoidal choroidal vasculopathy, wet AMD.  Stable now on Eylea.  We will repeat injection today and reevaluate next in 8 to 9 weeks after completing complete loading of medication on board now  2.  3.  Ophthalmic Meds Ordered this visit:  Meds ordered this encounter  Medications   aflibercept (EYLEA) SOLN 2 mg       Return in about 9 weeks (around 02/16/2022) for dilate, OS, EYLEA OCT.  There are no Patient Instructions on file for this visit.   Explained the diagnoses, plan, and follow up with the patient and they expressed understanding.  Patient expressed understanding of the importance of proper follow up care.   Alford Highland Tyrrell Stephens M.D. Diseases & Surgery of the Retina and Vitreous [facility] 12/15/21     Abbreviations: M myopia (nearsighted); A astigmatism; H hyperopia (farsighted); P presbyopia; Mrx spectacle prescription;  CTL contact lenses; OD right eye; OS left eye; OU both eyes  XT exotropia; ET esotropia; PEK punctate epithelial keratitis; PEE punctate epithelial erosions; DES  dry eye syndrome; MGD meibomian gland dysfunction; ATs artificial tears; PFAT's preservative free artificial tears; NSC nuclear sclerotic cataract; PSC posterior subcapsular cataract; ERM epi-retinal membrane; PVD posterior vitreous detachment; RD retinal detachment; DM diabetes mellitus; DR diabetic retinopathy; NPDR non-proliferative diabetic retinopathy; PDR proliferative diabetic retinopathy; CSME clinically significant macular edema; DME diabetic macular edema; dbh dot blot hemorrhages; CWS cotton wool spot; POAG primary open angle glaucoma; C/D cup-to-disc ratio; HVF humphrey visual field; GVF goldmann visual field; OCT optical coherence tomography; IOP intraocular pressure; BRVO Branch retinal vein occlusion; CRVO central retinal vein occlusion; CRAO central retinal artery occlusion; BRAO branch retinal artery occlusion; RT retinal tear; SB scleral buckle; PPV pars plana vitrectomy; VH Vitreous hemorrhage; PRP panretinal laser photocoagulation; IVK intravitreal kenalog; VMT vitreomacular traction; MH Macular hole;  NVD neovascularization of the disc; NVE neovascularization elsewhere; AREDS age related eye disease study; ARMD age related macular degeneration; POAG primary open angle glaucoma; EBMD epithelial/anterior basement membrane dystrophy; ACIOL anterior chamber intraocular lens; IOL intraocular lens; PCIOL posterior chamber intraocular lens; Phaco/IOL phacoemulsification with intraocular lens placement; PRK photorefractive keratectomy; LASIK laser assisted in situ keratomileusis; HTN hypertension; DM diabetes mellitus; COPD chronic obstructive pulmonary disease

## 2021-12-15 NOTE — Assessment & Plan Note (Signed)
Stable over time. 

## 2021-12-15 NOTE — Assessment & Plan Note (Signed)
Not progressive at this date

## 2021-12-15 NOTE — Assessment & Plan Note (Addendum)
Form of wet AMD OS.

## 2021-12-15 NOTE — Assessment & Plan Note (Signed)
OS controlled scotoma, now loaded fully with Eylea.  Repeat injection today reevaluate next in 8 to 9 weeks.  Goal is scotoma control.

## 2022-02-15 ENCOUNTER — Encounter (INDEPENDENT_AMBULATORY_CARE_PROVIDER_SITE_OTHER): Payer: Medicare Other | Admitting: Ophthalmology

## 2022-02-16 ENCOUNTER — Encounter (INDEPENDENT_AMBULATORY_CARE_PROVIDER_SITE_OTHER): Payer: Medicare Other | Admitting: Ophthalmology

## 2022-05-10 ENCOUNTER — Ambulatory Visit
Admission: EM | Admit: 2022-05-10 | Discharge: 2022-05-10 | Disposition: A | Discharge status: Home / Self Care | Payer: Medicare Other

## 2022-05-10 DIAGNOSIS — J069 Acute upper respiratory infection, unspecified: Secondary | ICD-10-CM | POA: Diagnosis not present

## 2022-05-10 MED ORDER — PREDNISONE 20 MG PO TABS: 20.0000 mg | ORAL_TABLET | Freq: Every day | ORAL | 0 refills | Status: AC

## 2022-05-10 MED ORDER — IPRATROPIUM BROMIDE 0.03 % NA SOLN: 2.0000 | Freq: Two times a day (BID) | NASAL | 12 refills | Status: AC

## 2022-05-10 MED ORDER — PREDNISONE 10 MG PO TABS: 20.0000 mg | ORAL_TABLET | Freq: Every day | ORAL | 0 refills | Status: DC

## 2022-05-10 NOTE — ED Provider Notes (Addendum)
[facility]    CSN: [id number] Arrival date & time: 05/10/22  1522      History   Chief Complaint Chief Complaint  Patient presents with   Nasal Congestion    Nasal congestion, coughing, and stuffy nose. X5 days    HPI Barbara Wells is a 67 y.o. female who presents for evaluation of URI symptoms for 5 days. Patient reports associated symptoms of nasal congestion, postnasal drip with cough. Denies N/V/D, fevers, chills, sore throat, ear pain, body aches, shortness of breath. Patient does have a hx of asthma.  States Barbara Wells has an albuterol inhaler at home. no smoking. No known sick contacts and no recent travel. Pt is vaccinated for COVID. Pt is not vaccinated for flu this season. Pt has taken DayQuil and Flonase OTC for symptoms. Pt has no other concerns at this time.   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 No pertinent surgical history.  OB History   No obstetric history on  file.      Home Medications    Prior to Admission medications   Medication Sig Start Date End Date Taking? Authorizing Provider  albuterol (VENTOLIN HFA) 108 (90 Base) MCG/ACT inhaler Inhale 2 puffs into the lungs every 6 (six) hours as needed for wheezing or shortness of breath. 05/18/20  Yes Meccariello, Bernita Raisin, MD  aspirin EC 81 MG tablet Take 1 tablet (81 mg total) by mouth daily. 03/08/19  Yes Jettie Booze, MD  atorvastatin (LIPITOR) 40 MG tablet Take 1 tablet (40 mg total) by mouth daily. 05/25/21  Yes Zola Button, MD  ipratropium (ATROVENT) 0.03 % nasal spray Place 2 sprays into both nostrils every 12 (twelve) hours. 05/10/22  Yes Melynda Ripple, NP  latanoprost (XALATAN) 0.005 % ophthalmic solution Place 1 drop into both eyes at bedtime. 06/22/18  Yes Caroline More, DO  Multiple Vitamin (MULTIVITAMIN WITH MINERALS) TABS tablet Take 1 tablet by mouth at bedtime.   Yes [provider]  ofloxacin (OCUFLOX) 0.3 % ophthalmic solution Place into the left eye. 04/25/22  Yes [provider]  predniSONE (DELTASONE) 20 MG tablet Take 1 tablet (20 mg total) by mouth daily with breakfast for 3 days. 05/10/22 05/13/22 Yes Melynda Ripple, NP  acetaminophen (TYLENOL) 500 MG tablet Take 1,000 mg by mouth every 6 (six) hours as needed for headache (pain).     [provider]  ibuprofen (ADVIL,MOTRIN) 200 MG tablet Take 200 mg by  mouth every 6 (six) hours as needed for headache (pain).    [provider]    Family History Family History  Problem Relation Age of Onset   Stroke Mother    Hypertension Mother    Heart disease Father    Kidney disease Brother    Hernia Son    ADD / ADHD Son     Social History Social History   Tobacco Use   Smoking status: Never   Smokeless tobacco: Never  Substance Use Topics   Alcohol use: No   Drug use: No     Allergies   Griseofulvin   Review of Systems Review of Systems  HENT:  Positive for congestion and  postnasal drip.   Respiratory:  Positive for cough.      Physical Exam Triage Vital Signs ED Triage Vitals  Enc Vitals Group     BP 05/10/22 1534 117/89     Pulse Rate 05/10/22 1534 83     Resp 05/10/22 1534 18     Temp 05/10/22 1534 98.9 F (37.2 C)     Temp Source 05/10/22 1534 Oral     SpO2 05/10/22 1534 96 %     Weight --      Height 05/10/22 1531 5\' 6"  (1.676 m)     Head Circumference --      Peak Flow --      Pain Score 05/10/22 1531 0     Pain Loc --      Pain Edu? --      Excl. in [place]? --    No data found.  Updated Vital Signs BP 117/89 (BP Location: Left Arm)   Pulse 83   Temp 98.9 F (37.2 C) (Oral)   Resp 18   Ht 5\' 6"  (1.676 m)   SpO2 96%   BMI 30.83 kg/m   Visual Acuity Right Eye Distance:   Left Eye Distance:   Bilateral Distance:    Right Eye Near:   Left Eye Near:    Bilateral Near:     Physical Exam Vitals and nursing note reviewed.  Constitutional:      General: Barbara Wells is not in acute distress.    Appearance: Barbara Wells is well-developed. Barbara Wells is not ill-appearing.  HENT:     Head: Normocephalic and atraumatic.     Right Ear: Tympanic membrane and ear canal normal.     Left Ear: Tympanic membrane and ear canal normal.     Nose: Congestion present.     Right Turbinates: Swollen.     Left Turbinates: Swollen.     Mouth/Throat:     Mouth: Mucous membranes are moist.     Pharynx: Oropharynx is clear. Uvula midline. No oropharyngeal exudate or posterior oropharyngeal erythema.     Tonsils: No tonsillar exudate or tonsillar abscesses.  Eyes:     Conjunctiva/sclera: Conjunctivae normal.     Pupils: Pupils are equal, round, and reactive to light.  Cardiovascular:     Rate and Rhythm: Normal rate and regular rhythm.     Heart sounds: Normal heart sounds.  Pulmonary:     Effort: Pulmonary effort is normal.     Breath sounds: Normal breath sounds.  Musculoskeletal:     Cervical back: Normal range of motion and neck supple.  Lymphadenopathy:      Cervical: No cervical adenopathy.  Skin:    General: Skin is warm and dry.  Neurological:     General: No focal deficit present.     Mental  Status: Barbara Wells is alert and oriented to person, place, and time.  Psychiatric:        Mood and Affect: Mood normal.        Behavior: Behavior normal.      UC Treatments / Results  Labs (all labs ordered are listed, but only abnormal results are displayed) Labs Reviewed - No data to display  EKG   Radiology No results found.  Procedures Procedures (including critical care time)  Medications Ordered in UC Medications - No data to display  Initial Impression / Assessment and Plan / UC Course  I have reviewed the triage vital signs and the nursing notes.  Pertinent labs & imaging results that were available during my care of the patient were reviewed by me and considered in my medical decision making (see chart for details).     I reviewed exam and symptoms with patient.  No red flags on exam. Patient declined COVID testing Atrovent nasal spray Low-dose short course of prednisone to help with nasal and sinus inflammation Nasal rinses as tolerated Humidifier at night Rest and fluids PCP follow-up in 2 to 3 days for recheck ER precautions reviewed and patient verbalized understanding Final Clinical Impressions(s) / UC Diagnoses   Final diagnoses:  Viral upper respiratory infection     Discharge Instructions      Atrovent nasal spray twice daily for nasal congestion/runny nose Prednisone daily for 3 days Humidifier at night Saline nasal rinses as needed Rest Follow-up with your PCP in 2 days for recheck Please go to the emergency room if you have any worsening symptoms    ED Prescriptions     Medication Sig Dispense Auth. Provider   ipratropium (ATROVENT) 0.03 % nasal spray Place 2 sprays into both nostrils every 12 (twelve) hours. 30 mL Melynda Ripple, NP   predniSONE (DELTASONE) 10 MG tablet  (Status: Discontinued)  Take 2 tablets (20 mg total) by mouth daily for 3 days. 6 tablet Melynda Ripple, NP   predniSONE (DELTASONE) 20 MG tablet Take 1 tablet (20 mg total) by mouth daily with breakfast for 3 days. 3 tablet Melynda Ripple, NP      PDMP not reviewed this encounter.   Melynda Ripple, NP 05/10/22 1558    Melynda Ripple, NP 05/10/22 1600

## 2022-05-10 NOTE — ED Triage Notes (Signed)
Pt states that she has some nasal congestion, stuffy nose, and cough. X5 days

## 2022-05-10 NOTE — Discharge Instructions (Signed)
Atrovent nasal spray twice daily for nasal congestion/runny nose Prednisone daily for 3 days Humidifier at night Saline nasal rinses as needed Rest Follow-up with your PCP in 2 days for recheck Please go to the emergency room if you have any worsening symptoms

## 2022-06-01 ENCOUNTER — Encounter: Payer: Self-pay | Admitting: Family Medicine

## 2022-06-01 ENCOUNTER — Ambulatory Visit (INDEPENDENT_AMBULATORY_CARE_PROVIDER_SITE_OTHER): Payer: Medicare Other | Admitting: Family Medicine

## 2022-06-01 VITALS — BP 125/82 | HR 63 | Ht 66.0 in | Wt 172.0 lb

## 2022-06-01 DIAGNOSIS — J45909 Unspecified asthma, uncomplicated: Secondary | ICD-10-CM

## 2022-06-01 DIAGNOSIS — J309 Allergic rhinitis, unspecified: Secondary | ICD-10-CM

## 2022-06-01 DIAGNOSIS — R7303 Prediabetes: Secondary | ICD-10-CM

## 2022-06-01 DIAGNOSIS — Z780 Asymptomatic menopausal state: Secondary | ICD-10-CM

## 2022-06-01 DIAGNOSIS — R400 Somnolence: Secondary | ICD-10-CM | POA: Diagnosis not present

## 2022-06-01 DIAGNOSIS — H6121 Impacted cerumen, right ear: Secondary | ICD-10-CM

## 2022-06-01 DIAGNOSIS — Z0000 Encounter for general adult medical examination without abnormal findings: Secondary | ICD-10-CM | POA: Diagnosis not present

## 2022-06-01 DIAGNOSIS — Z13820 Encounter for screening for osteoporosis: Secondary | ICD-10-CM

## 2022-06-01 DIAGNOSIS — E041 Nontoxic single thyroid nodule: Secondary | ICD-10-CM

## 2022-06-01 DIAGNOSIS — G4700 Insomnia, unspecified: Secondary | ICD-10-CM

## 2022-06-01 DIAGNOSIS — E785 Hyperlipidemia, unspecified: Secondary | ICD-10-CM

## 2022-06-01 DIAGNOSIS — E0590 Thyrotoxicosis, unspecified without thyrotoxic crisis or storm: Secondary | ICD-10-CM

## 2022-06-01 MED ORDER — MELATONIN 3 MG PO TABS: 3.0000 mg | ORAL_TABLET | Freq: Every evening | ORAL | 0 refills | Status: AC | PRN

## 2022-06-01 MED ORDER — ALBUTEROL SULFATE HFA 108 (90 BASE) MCG/ACT IN AERS: 2.0000 | INHALATION_SPRAY | Freq: Four times a day (QID) | RESPIRATORY_TRACT | 2 refills | Status: DC | PRN

## 2022-06-01 MED ORDER — CARBAMIDE PEROXIDE 6.5 % OT SOLN: 5.0000 [drp] | Freq: Two times a day (BID) | OTIC | 0 refills | Status: AC

## 2022-06-01 NOTE — Assessment & Plan Note (Signed)
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  Possible sleep apnea as well, STOP-BANG score 3.  Patient will let me know if she would like a referral to pulmonology for sleep study as she desires in-home sleep study.

## 2022-06-01 NOTE — Assessment & Plan Note (Signed)
Noted on prior ultrasound in 2018 will obtain follow-up imaging today

## 2022-06-01 NOTE — Patient Instructions (Addendum)
It was nice seeing you today!  I recommend getting the Tdap vaccine and the shingles vaccine at your pharmacy. Think about getting the pneumonia shot.  Use Debrox for earwax up to 4 days as needed.  Continue using nasal spray and humidifier.  Go to your thyroid ultrasound as scheduled.  Get your bone density scan.  Try melatonin as needed for sleep. Let me know if you would like to get your home sleep study scheduled.  Stay well, Zola Button, MD [place] [phone]  --  Make sure to check out at the front desk before you leave today.  Please arrive at least 15 minutes prior to your scheduled appointments.  If you had blood work today, I will send you a MyChart message or a letter if results are normal. Otherwise, I will give you a call.  If you had a referral placed, they will call you to set up an appointment. Please give [place] a call if you don't hear back in the next 2 weeks.  If you need additional refills before your next appointment, please call your pharmacy first.

## 2022-06-01 NOTE — Progress Notes (Signed)
SUBJECTIVE:   CHIEF COMPLAINT / HPI:  Chief Complaint  Patient presents with   Annual Exam    Having congestion and right ear pain intermittently for 1 month Went to urgent care a few weeks ago  Has trouble sleeping, wakes up in the middle of the night at 3am Noted snoring, daytime somnolence Concerned about sleep apnea, never had sleep study Would not want to stay overnight in the hospital for sleep study  Has increased exercise, trying to lose weight Cycling 150 minutes per week  Retired October 2022, was doing night shifts as a CNA/med tech  PERTINENT  PMH / PSH: left thyroid nodule, PE, HLD, CAD, macular degeneration  Patient Care Team: Zola Button, MD as PCP - General (Family Medicine) Jettie Booze, MD as PCP - Cardiology (Cardiology)   OBJECTIVE:   BP 125/82   Pulse 63   Ht '5\' 6"'$  (1.676 m)   Wt 172 lb (78 kg)   SpO2 100%   BMI 27.76 kg/m   Physical Exam Constitutional:      General: She is not in acute distress. HENT:     Head: Normocephalic and atraumatic.     Right Ear: There is impacted cerumen.     Left Ear: Tympanic membrane normal. There is no impacted cerumen.     Nose: Congestion present.     Mouth/Throat:     Mouth: Mucous membranes are moist.     Pharynx: Oropharynx is clear.  Neck:     Comments: No thyromegaly Cardiovascular:     Rate and Rhythm: Normal rate and regular rhythm.  Pulmonary:     Effort: Pulmonary effort is normal. No respiratory distress.     Breath sounds: Normal breath sounds.  Musculoskeletal:     Cervical back: Neck supple.  Lymphadenopathy:     Cervical: No cervical adenopathy.  Neurological:     Mental Status: She is alert.         06/01/2022   10:20 AM  Depression screen PHQ 2/9  Decreased Interest 1  Down, Depressed, Hopeless 0  PHQ - 2 Score 1  Altered sleeping 2  Tired, decreased energy 0  Change in appetite 1  Feeling bad or failure about yourself  0  Trouble concentrating 0  Moving  slowly or fidgety/restless 0  Suicidal thoughts 0  PHQ-9 Score 4  Difficult doing work/chores Somewhat difficult     Wt Readings from Last 3 Encounters:  06/01/22 172 lb (78 kg)  10/06/21 191 lb (86.6 kg)  08/25/21 199 lb 2 oz (90.3 kg)        ASSESSMENT/PLAN:   Problem List Items Addressed This Visit       Respiratory   Asthma (Chronic)   Relevant Medications   albuterol (VENTOLIN HFA) 108 (90 Base) MCG/ACT inhaler     Endocrine   Left thyroid nodule    Noted on prior ultrasound in 2018 will obtain follow-up imaging today      Relevant Orders   TSH Rfx on Abnormal to Free T4   US THYROID     Other   Hyperlipidemia   Relevant Orders   Lipid Panel   Prediabetes   Relevant Orders   Hemoglobin A1c   Insomnia    Possibly due to prior night shift work.  Possible sleep apnea as well, STOP-BANG score 3.  Patient will let me know if she would like a referral to pulmonology for sleep study as she desires in-home sleep study.  Relevant Medications   melatonin 3 MG TABS tablet   Other Visit Diagnoses     Encounter for routine history and physical examination of adult    -  Primary   Relevant Orders   Comprehensive metabolic panel   Daytime somnolence       Encounter for osteoporosis screening in asymptomatic postmenopausal patient       Relevant Orders   DG Bone Density   Impacted cerumen, right ear       Relevant Medications   carbamide peroxide (DEBROX) 6.5 % OTIC solution   Allergic rhinitis, unspecified seasonality, unspecified trigger              HCM - pneumonia vaccine declined - colon cancer screening discussed in depth, declined - DXA ordered - she will see her OB/GYN regarding mammogram - recommended Tdap and shingles vaccine at pharmacy  Return in about 1 year (around 06/02/2023) for physical.   Zola Button, [place]

## 2022-06-02 DIAGNOSIS — E0590 Thyrotoxicosis, unspecified without thyrotoxic crisis or storm: Secondary | ICD-10-CM | POA: Insufficient documentation

## 2022-06-02 LAB — COMPREHENSIVE METABOLIC PANEL
ALT: 19 IU/L (ref 0–32)
AST: 17 IU/L (ref 0–40)
Albumin/Globulin Ratio: 2.2 (ref 1.2–2.2)
Albumin: 4.1 g/dL (ref 3.9–4.9)
Alkaline Phosphatase: 141 IU/L — ABNORMAL HIGH (ref 44–121)
BUN/Creatinine Ratio: 20 (ref 12–28)
BUN: 12 mg/dL (ref 8–27)
Bilirubin Total: 0.3 mg/dL (ref 0.0–1.2)
CO2: 24 mmol/L (ref 20–29)
Calcium: 9.8 mg/dL (ref 8.7–10.3)
Chloride: 106 mmol/L (ref 96–106)
Creatinine, Ser: 0.61 mg/dL (ref 0.57–1.00)
Globulin, Total: 1.9 g/dL (ref 1.5–4.5)
Glucose: 95 mg/dL (ref 70–99)
Potassium: 4.6 mmol/L (ref 3.5–5.2)
Sodium: 144 mmol/L (ref 134–144)
Total Protein: 6 g/dL (ref 6.0–8.5)
eGFR: 97 mL/min/{1.73_m2} (ref >59)

## 2022-06-02 LAB — LIPID PANEL
Chol/HDL Ratio: 2.9 ratio (ref 0.0–4.4)
Cholesterol, Total: 114 mg/dL (ref 100–199)
HDL: 39 mg/dL — ABNORMAL LOW (ref >39)
LDL Chol Calc (NIH): 55 mg/dL (ref 0–99)
Triglycerides: 109 mg/dL (ref 0–149)
VLDL Cholesterol Cal: 20 mg/dL (ref 5–40)

## 2022-06-02 LAB — HEMOGLOBIN A1C
Est. average glucose Bld gHb Est-mCnc: 134 mg/dL
Hgb A1c MFr Bld: 6.3 % — ABNORMAL HIGH (ref 4.8–5.6)

## 2022-06-02 LAB — TSH RFX ON ABNORMAL TO FREE T4: TSH: 0.388 u[IU]/mL — ABNORMAL LOW (ref 0.450–4.500)

## 2022-06-02 LAB — T4F: T4,Free (Direct): 1.42 ng/dL (ref 0.82–1.77)

## 2022-06-02 NOTE — Assessment & Plan Note (Signed)
Plan to repeat labs at follow-up

## 2022-06-07 ENCOUNTER — Other Ambulatory Visit: Payer: Self-pay | Admitting: Family Medicine

## 2022-06-07 DIAGNOSIS — E785 Hyperlipidemia, unspecified: Secondary | ICD-10-CM

## 2022-06-22 ENCOUNTER — Telehealth: Payer: Self-pay | Admitting: Family Medicine

## 2022-06-22 NOTE — Telephone Encounter (Signed)
Contacted [facility] to schedule their annual wellness visit. Appointment made for 06/28/2022.  Thank you,  [place] Direct dial  [phone]

## 2022-06-28 ENCOUNTER — Ambulatory Visit (INDEPENDENT_AMBULATORY_CARE_PROVIDER_SITE_OTHER): Payer: Medicare Other

## 2022-06-28 VITALS — Ht 66.0 in | Wt 172.0 lb

## 2022-06-28 DIAGNOSIS — Z0000 Encounter for general adult medical examination without abnormal findings: Secondary | ICD-10-CM

## 2022-06-28 NOTE — Progress Notes (Cosign Needed Addendum)
Subjective:   Barbara Wells is a 67 y.o. female who presents for Medicare Annual (Subsequent) preventive examination.  Review of Systems    Virtual Visit via Telephone Note  I connected with  [name] on 06/28/22 at  2:00 PM EDT by telephone and verified that I am speaking with the correct person using two identifiers.  Location: Patient: Home Provider: Office Persons participating in the virtual visit: patient/Nurse Health Advisor   I discussed the limitations, risks, security and privacy concerns of performing an evaluation and management service by telephone and the availability of in person appointments. The patient expressed understanding and agreed to proceed.  Interactive audio and video telecommunications were attempted between this nurse and patient, however failed, due to patient having technical difficulties OR patient did not have access to video capability.  We continued and completed visit with audio only.  Some vital signs may be absent or patient reported.   Criselda Peaches, LPN  Cardiac Risk Factors include: advanced age (>66men, >29 women)     Objective:    Today's Vitals   06/28/22 1405  Weight: 172 lb (78 kg)  Height: 5\' 6"  (1.676 m)  PainSc: 0-No pain   Body mass index is 27.76 kg/m.     06/28/2022    2:15 PM 06/01/2022   10:21 AM 08/25/2021    2:32 PM 05/25/2021    2:47 PM 05/18/2020    9:46 AM 09/28/2017   10:39 AM 04/10/2017    8:40 AM  Advanced Directives  Does Patient Have a Medical Advance Directive? No No No No No No No  Would patient like information on creating a medical advance directive? No - Patient declined No - Patient declined No - Patient declined No - Patient declined No - Patient declined No - Patient declined No - Patient declined    Current Medications (verified) Outpatient Encounter Medications as of 06/28/2022  Medication Sig   acetaminophen (TYLENOL) 500 MG tablet Take 1,000 mg by mouth every 6 (six) hours as  needed for headache (pain).    albuterol (VENTOLIN HFA) 108 (90 Base) MCG/ACT inhaler Inhale 2 puffs into the lungs every 6 (six) hours as needed for wheezing or shortness of breath.   aspirin EC 81 MG tablet Take 1 tablet (81 mg total) by mouth daily.   atorvastatin (LIPITOR) 40 MG tablet Take 1 tablet by mouth once daily   carbamide peroxide (DEBROX) 6.5 % OTIC solution Place 5 drops into the right ear 2 (two) times daily. Use for 4 days as needed   ibuprofen (ADVIL,MOTRIN) 200 MG tablet Take 200 mg by mouth every 6 (six) hours as needed for headache (pain).   ipratropium (ATROVENT) 0.03 % nasal spray Place 2 sprays into both nostrils every 12 (twelve) hours.   latanoprost (XALATAN) 0.005 % ophthalmic solution Place 1 drop into both eyes at bedtime.   melatonin 3 MG TABS tablet Take 1 tablet (3 mg total) by mouth at bedtime as needed.   Multiple Vitamin (MULTIVITAMIN WITH MINERALS) TABS tablet Take 1 tablet by mouth at bedtime.   ofloxacin (OCUFLOX) 0.3 % ophthalmic solution Place into the left eye.   No facility-administered encounter medications on file as of 06/28/2022.    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 No pertinent surgical history. Family History  Problem Relation Age of Onset   Stroke Mother    Hypertension Mother    Heart disease Father    Kidney disease Brother    Hernia Son    ADD / ADHD Son    Social History   Socioeconomic History   Marital status: Single    Spouse name: Not on file   Number of children: Not on file   Years of education: Not on file   Highest education level: Not on file  Occupational History   Not on file  Tobacco Use    Smoking status: Never   Smokeless tobacco: Never  Substance and Sexual Activity   Alcohol use: No   Drug use: No   Sexual activity: Never  Other Topics Concern   Not on file  Social History Narrative   Not on file   Social Determinants of Health   Financial Resource Strain: Low Risk  (06/28/2022)   Overall Financial Resource Strain (CARDIA)    Difficulty of Paying Living Expenses: Not hard at all  Food Insecurity: No Food Insecurity (06/28/2022)   Hunger Vital Sign    Worried About Running Out of Food in the Last Year: Never true    [place] in the Last Year: Never true  Transportation Needs: No Transportation Needs (06/28/2022)   PRAPARE - Hydrologist (Medical): No    Lack of Transportation (Non-Medical): No  Physical Activity: Sufficiently Active (06/28/2022)   Exercise Vital Sign    Days of Exercise per Week: 5 days    Minutes of Exercise per Session: 60 min  Stress: No Stress Concern Present (06/28/2022)   [place]    Feeling of Stress : Not at all  Social Connections: Unknown (06/28/2022)   Social Connection and Isolation Panel [NHANES]    Frequency of Communication with Friends and Family: More than three times a week    Frequency of Social Gatherings with Friends and Family: More than three times a week    Attends Religious Services: More than 4 times per year    Active Member of [employer] or Organizations: Yes    Attends Music therapist: More than 4 times per year    Marital Status: Patient declined    Tobacco Counseling Counseling given: Not Answered   Clinical Intake:  Pre-visit preparation completed: No  Pain : No/denies pain Pain Score: 0-No pain     BMI - recorded: 27.76 Nutritional Status: BMI 25 -29 Overweight Nutritional Risks: None Diabetes: No  How often do you need to have someone help you when you read instructions, pamphlets, or  other written materials from your doctor or pharmacy?: 3 - Sometimes (Son assist)  Diabetic? No  Interpreter Needed?: No  Information entered by :: Rolene Arbour LPN   Activities of Daily Living    06/28/2022    2:13 PM  In your present state of health, do you have any difficulty performing the following activities:  Hearing? 0  Vision? 0  Difficulty concentrating or making decisions? 0  Walking or climbing stairs? 0  Dressing or bathing? 0  Doing errands, shopping? 0  Preparing Food and eating ? N  Using the Toilet? N  In the past six months, have you accidently leaked  urine? N  Do you have problems with loss of bowel control? N  Managing your Medications? N  Managing your Finances? N  Housekeeping or managing your Housekeeping? N    Patient Care Team: Zola Button, MD as PCP - General (Family Medicine) Jettie Booze, MD as PCP - Cardiology (Cardiology)  Indicate any recent Medical Services you may have received from other than [facility] providers in the past year (date may be approximate).     Assessment:   This is a routine wellness examination for [place].  Hearing/Vision screen Hearing Screening - Comments:: Denies hearing difficulties   Vision Screening - Comments:: Wears rx glasses - up to date with routine eye exams with  Dr Katy Fitch  Dietary issues and exercise activities discussed: Exercise limited by: None identified   Goals Addressed               This Visit's Progress     Stay Healthy (pt-stated)         Depression Screen    06/28/2022    2:09 PM 06/01/2022   10:20 AM 08/25/2021    2:33 PM 05/18/2020    9:43 AM 03/06/2019   10:40 AM 09/28/2017   10:39 AM 04/10/2017    8:40 AM  PHQ 2/9 Scores  PHQ - 2 Score 0 1 0 0 0 0 0  PHQ- 9 Score 0 4 1 0       Fall Risk    06/28/2022    2:14 PM 06/01/2022   10:20 AM 08/25/2021    2:33 PM 05/25/2021    2:47 PM 05/18/2020    9:43 AM  [place] in the past year? 0 0 0 1 0  Number falls in past  yr: 0 0 0 0   Injury with Fall? 0 0 0 0   Risk for fall due to : No Fall Risks      Follow up Falls prevention discussed        FALL RISK PREVENTION PERTAINING TO THE HOME:  Any stairs in or around the home? Yes  If so, are there any without handrails? No  Home free of loose throw rugs in walkways, pet beds, electrical cords, etc? Yes  Adequate lighting in your home to reduce risk of falls? Yes   ASSISTIVE DEVICES UTILIZED TO PREVENT FALLS:  Life alert? No  Use of a cane, walker or w/c? No  Grab bars in the bathroom? No  Shower chair or bench in shower? Yes Elevated toilet seat or a handicapped toilet? No   TIMED UP AND GO:  Was the test performed? No . Audio Visit   Cognitive Function:        06/28/2022    2:15 PM  6CIT Screen  What Year? 0 points  What month? 0 points  What time? 0 points  Count back from 20 0 points  Months in reverse 0 points  Repeat phrase 0 points  Total Score 0 points    Immunizations Immunization History  Administered Date(s) Administered   PFIZER Comirnaty(Gray Top)Covid-19 Tri-Sucrose Vaccine 05/22/2020   PFIZER(Purple Top)SARS-COV-2 Vaccination 05/26/2019, 06/19/2019    TDAP status: Due, Education has been provided regarding the importance of this vaccine. Advised may receive this vaccine at local pharmacy or Health Dept. Aware to provide a copy of the vaccination record if obtained from local pharmacy or Health Dept. Verbalized acceptance and understanding.  Flu Vaccine status: Declined, Education has been provided regarding the importance of this vaccine but  patient still declined. Advised may receive this vaccine at local pharmacy or Health Dept. Aware to provide a copy of the vaccination record if obtained from local pharmacy or Health Dept. Verbalized acceptance and understanding.  Pneumococcal vaccine status: Declined,  Education has been provided regarding the importance of this vaccine but patient still declined. Advised may  receive this vaccine at local pharmacy or Health Dept. Aware to provide a copy of the vaccination record if obtained from local pharmacy or Health Dept. Verbalized acceptance and understanding.   Covid-19 vaccine status: Completed vaccines  Qualifies for Shingles Vaccine? Yes   Zostavax completed No   Shingrix Completed?: No.    Education has been provided regarding the importance of this vaccine. Patient has been advised to call insurance company to determine out of pocket expense if they have not yet received this vaccine. Advised may also receive vaccine at local pharmacy or Health Dept. Verbalized acceptance and understanding.  Screening Tests Health Maintenance  Topic Date Due   DTaP/Tdap/Td (1 - Tdap) Never done   DEXA SCAN  Never done   INFLUENZA VACCINE  07/03/2022 (Originally 11/02/2021)   COVID-19 Vaccine (4 - 2023-24 season) 07/14/2022 (Originally 12/03/2021)   Zoster Vaccines- Shingrix (1 of 2) 09/28/2022 (Originally 01/29/2004)   Pneumonia Vaccine 37+ Years old (1 of 1 - PCV) 06/28/2023 (Originally 01/29/2019)   MAMMOGRAM  06/28/2023 (Originally 01/29/2004)   COLONOSCOPY (Pts 45-24yrs Insurance coverage will need to be confirmed)  06/28/2023 (Originally 01/29/1999)   Medicare Annual Wellness (AWV)  06/28/2023   Hepatitis C Screening  Completed   HPV VACCINES  Aged Out    Health Maintenance  Health Maintenance Due  Topic Date Due   DTaP/Tdap/Td (1 - Tdap) Never done   DEXA SCAN  Never done    Colorectal cancer screening: Referral to GI placed Patient deferred. Pt aware the office will call re: appt.  Mammogram status: Ordered Patient deferred. Pt provided with contact info and advised to call to schedule appt.   Bone Density status: Ordered Scheduled for 11/16/22. Pt provided with contact info and advised to call to schedule appt.  Lung Cancer Screening: (Low Dose CT Chest recommended if Age 53-80 years, 30 pack-year currently smoking OR have quit w/in 15years.) does not  qualify.    Additional Screening:  Hepatitis C Screening: does qualify; Completed 05/31/16  Vision Screening: Recommended annual ophthalmology exams for early detection of glaucoma and other disorders of the eye. Is the patient up to date with their annual eye exam?  Yes  Who is the provider or what is the name of the office in which the patient attends annual eye exams? Dr Katy Fitch If pt is not established with a provider, would they like to be referred to a provider to establish care? No .   Dental Screening: Recommended annual dental exams for proper oral hygiene  Community Resource Referral / Chronic Care Management:  CRR required this visit?  No   CCM required this visit?  No      Plan:     I have personally reviewed and noted the following in the patient's chart:   Medical and social history Use of alcohol, tobacco or illicit drugs  Current medications and supplements including opioid prescriptions. Patient is not currently taking opioid prescriptions. Functional ability and status Nutritional status Physical activity Advanced directives List of other physicians Hospitalizations, surgeries, and ER visits in previous 12 months Vitals Screenings to include cognitive, depression, and falls Referrals and appointments  In addition, I  have reviewed and discussed with patient certain preventive protocols, quality metrics, and best practice recommendations. A written personalized care plan for preventive services as well as general preventive health recommendations were provided to patient.     Criselda Peaches, LPN   [id number]   Nurse Notes: None      I have reviewed this visit and agree with the documentation.  [place]

## 2022-06-28 NOTE — Patient Instructions (Addendum)
Barbara Wells , Thank you for taking time to come for your Medicare Wellness Visit. I appreciate your ongoing commitment to your health goals. Please review the following plan we discussed and let me know if I can assist you in the future.   These are the goals we discussed:  Goals       Stay Healthy (pt-stated)        This is a list of the screening recommended for you and due dates:  Health Maintenance  Topic Date Due   DTaP/Tdap/Td vaccine (1 - Tdap) Never done   DEXA scan (bone density measurement)  Never done   Flu Shot  07/03/2022*   COVID-19 Vaccine (4 - 2023-24 season) 07/14/2022*   Zoster (Shingles) Vaccine (1 of 2) 09/28/2022*   Pneumonia Vaccine (1 of 1 - PCV) 06/28/2023*   Mammogram  06/28/2023*   Colon Cancer Screening  06/28/2023*   Medicare Annual Wellness Visit  06/28/2023   Hepatitis C Screening: USPSTF Recommendation to screen - Ages 18-79 yo.  Completed   HPV Vaccine  Aged Out  *Topic was postponed. The date shown is not the original due date.    Advanced directives: Advance directive discussed with you today. Even though you declined this today, please call our office should you change your mind, and we can give you the proper paperwork for you to fill out.   Conditions/risks identified: None  Next appointment: Follow up in one year for your annual wellness visit    Preventive Care 65 Years and Older, Female Preventive care refers to lifestyle choices and visits with your health care provider that can promote health and wellness. What does preventive care include? A yearly physical exam. This is also called an annual well check. Dental exams once or twice a year. Routine eye exams. Ask your health care provider how often you should have your eyes checked. Personal lifestyle choices, including: Daily care of your teeth and gums. Regular physical activity. Eating a healthy diet. Avoiding tobacco and drug use. Limiting alcohol use. Practicing safe  sex. Taking low-dose aspirin every day. Taking vitamin and mineral supplements as recommended by your health care provider. What happens during an annual well check? The services and screenings done by your health care provider during your annual well check will depend on your age, overall health, lifestyle risk factors, and family history of disease. Counseling  Your health care provider may ask you questions about your: Alcohol use. Tobacco use. Drug use. Emotional well-being. Home and relationship well-being. Sexual activity. Eating habits. History of falls. Memory and ability to understand (cognition). Work and work Statistician. Reproductive health. Screening  You may have the following tests or measurements: Height, weight, and BMI. Blood pressure. Lipid and cholesterol levels. These may be checked every 5 years, or more frequently if you are over 88 years old. Skin check. Lung cancer screening. You may have this screening every year starting at age 35 if you have a 30-pack-year history of smoking and currently smoke or have quit within the past 15 years. Fecal occult blood test (FOBT) of the stool. You may have this test every year starting at age 47. Flexible sigmoidoscopy or colonoscopy. You may have a sigmoidoscopy every 5 years or a colonoscopy every 10 years starting at age 71. Hepatitis C blood test. Hepatitis B blood test. Sexually transmitted disease (STD) testing. Diabetes screening. This is done by checking your blood sugar (glucose) after you have not eaten for a while (fasting). You may have this  done every 1-3 years. Bone density scan. This is done to screen for osteoporosis. You may have this done starting at age 61. Mammogram. This may be done every 1-2 years. Talk to your health care provider about how often you should have regular mammograms. Talk with your health care provider about your test results, treatment options, and if necessary, the need for more  tests. Vaccines  Your health care provider may recommend certain vaccines, such as: Influenza vaccine. This is recommended every year. Tetanus, diphtheria, and acellular pertussis (Tdap, Td) vaccine. You may need a Td booster every 10 years. Zoster vaccine. You may need this after age 50. Pneumococcal 13-valent conjugate (PCV13) vaccine. One dose is recommended after age 38. Pneumococcal polysaccharide (PPSV23) vaccine. One dose is recommended after age 91. Talk to your health care provider about which screenings and vaccines you need and how often you need them. This information is not intended to replace advice given to you by your health care provider. Make sure you discuss any questions you have with your health care provider. Document Released: 04/17/2015 Document Revised: 12/09/2015 Document Reviewed: 01/20/2015 Elsevier Interactive Patient Education  2017 [place] Prevention in the Home Falls can cause injuries. They can happen to people of all ages. There are many things you can do to make your home safe and to help prevent falls. What can I do on the outside of my home? Regularly fix the edges of walkways and driveways and fix any cracks. Remove anything that might make you trip as you walk through a door, such as a raised step or threshold. Trim any bushes or trees on the path to your home. Use bright outdoor lighting. Clear any walking paths of anything that might make someone trip, such as rocks or tools. Regularly check to see if handrails are loose or broken. Make sure that both sides of any steps have handrails. Any raised decks and porches should have guardrails on the edges. Have any leaves, snow, or ice cleared regularly. Use sand or salt on walking paths during winter. Clean up any spills in your garage right away. This includes oil or grease spills. What can I do in the bathroom? Use night lights. Install grab bars by the toilet and in the tub and shower.  Do not use towel bars as grab bars. Use non-skid mats or decals in the tub or shower. If you need to sit down in the shower, use a plastic, non-slip stool. Keep the floor dry. Clean up any water that spills on the floor as soon as it happens. Remove soap buildup in the tub or shower regularly. Attach bath mats securely with double-sided non-slip rug tape. Do not have throw rugs and other things on the floor that can make you trip. What can I do in the bedroom? Use night lights. Make sure that you have a light by your bed that is easy to reach. Do not use any sheets or blankets that are too big for your bed. They should not hang down onto the floor. Have a firm chair that has side arms. You can use this for support while you get dressed. Do not have throw rugs and other things on the floor that can make you trip. What can I do in the kitchen? Clean up any spills right away. Avoid walking on wet floors. Keep items that you use a lot in easy-to-reach places. If you need to reach something above you, use a strong step stool that  has a grab bar. Keep electrical cords out of the way. Do not use floor polish or wax that makes floors slippery. If you must use wax, use non-skid floor wax. Do not have throw rugs and other things on the floor that can make you trip. What can I do with my stairs? Do not leave any items on the stairs. Make sure that there are handrails on both sides of the stairs and use them. Fix handrails that are broken or loose. Make sure that handrails are as long as the stairways. Check any carpeting to make sure that it is firmly attached to the stairs. Fix any carpet that is loose or worn. Avoid having throw rugs at the top or bottom of the stairs. If you do have throw rugs, attach them to the floor with carpet tape. Make sure that you have a light switch at the top of the stairs and the bottom of the stairs. If you do not have them, ask someone to add them for you. What else  can I do to help prevent falls? Wear shoes that: Do not have high heels. Have rubber bottoms. Are comfortable and fit you well. Are closed at the toe. Do not wear sandals. If you use a stepladder: Make sure that it is fully opened. Do not climb a closed stepladder. Make sure that both sides of the stepladder are locked into place. Ask someone to hold it for you, if possible. Clearly mark and make sure that you can see: Any grab bars or handrails. First and last steps. Where the edge of each step is. Use tools that help you move around (mobility aids) if they are needed. These include: Canes. Walkers. Scooters. Crutches. Turn on the lights when you go into a dark area. Replace any light bulbs as soon as they burn out. Set up your furniture so you have a clear path. Avoid moving your furniture around. If any of your floors are uneven, fix them. If there are any pets around you, be aware of where they are. Review your medicines with your doctor. Some medicines can make you feel dizzy. This can increase your chance of falling. Ask your doctor what other things that you can do to help prevent falls. This information is not intended to replace advice given to you by your health care provider. Make sure you discuss any questions you have with your health care provider. Document Released: 01/15/2009 Document Revised: 08/27/2015 Document Reviewed: 04/25/2014 Elsevier Interactive Patient Education  2017 Else[employer]

## 2022-06-29 ENCOUNTER — Other Ambulatory Visit: Payer: Medicare Other

## 2022-07-19 ENCOUNTER — Other Ambulatory Visit: Payer: Medicare Other

## 2022-07-27 ENCOUNTER — Ambulatory Visit
Admission: RE | Admit: 2022-07-27 | Discharge: 2022-07-27 | Disposition: A | Discharge status: Home / Self Care | Payer: Medicare Other | Source: Ambulatory Visit | Attending: Family Medicine | Admitting: Family Medicine

## 2022-07-27 DIAGNOSIS — E041 Nontoxic single thyroid nodule: Secondary | ICD-10-CM

## 2022-11-16 ENCOUNTER — Ambulatory Visit
Admission: RE | Admit: 2022-11-16 | Discharge: 2022-11-16 | Disposition: A | Discharge status: Home / Self Care | Payer: Medicare Other | Source: Ambulatory Visit | Attending: Family Medicine | Admitting: Family Medicine

## 2022-11-16 DIAGNOSIS — Z780 Asymptomatic menopausal state: Secondary | ICD-10-CM

## 2022-12-06 ENCOUNTER — Encounter: Payer: Self-pay | Admitting: Family Medicine

## 2022-12-06 ENCOUNTER — Ambulatory Visit (INDEPENDENT_AMBULATORY_CARE_PROVIDER_SITE_OTHER): Payer: Medicare Other | Admitting: Family Medicine

## 2022-12-06 VITALS — BP 103/63 | HR 74 | Ht 66.0 in | Wt 185.4 lb

## 2022-12-06 DIAGNOSIS — M8580 Other specified disorders of bone density and structure, unspecified site: Secondary | ICD-10-CM | POA: Insufficient documentation

## 2022-12-06 DIAGNOSIS — M8588 Other specified disorders of bone density and structure, other site: Secondary | ICD-10-CM

## 2022-12-06 MED ORDER — ALENDRONATE SODIUM 70 MG PO TABS: 70.0000 mg | ORAL_TABLET | ORAL | 2 refills | Status: DC

## 2022-12-06 NOTE — Progress Notes (Signed)
    SUBJECTIVE:   CHIEF COMPLAINT / HPI: Follow-up DEXA scan  Alcohol - Does not drink alcohol. Smoking - Does not smoke. Execise - Every day. Cycling. Is active lifting things around house. Does do some "barbells".  Taking Nature Made Vitamin D and Calcium. 2 600mg  tablets of Calcium and Vitamin 2 combined. Taking for long time.   FRAX - 7.5% Major Osteoporotic fracture.  PERTINENT  PMH / PSH: Hx of PE, CAD  OBJECTIVE:   BP 103/63   Pulse 74   Ht 5\' 6"  (1.676 m)   Wt 185 lb 6 oz (84.1 kg)   SpO2 99%   BMI 29.92 kg/m   General: NAD, well appearing Neuro: A&O Respiratory: normal WOB on RA Extremities: Moving all 4 extremities equally   ASSESSMENT/PLAN:   Osteopenia of lumbar spine Assessment & Plan: DEXA scan T-score -2.4 of lumbar spine.  This meets criteria for osteopenia.  FRAX risk of major osteoporotic fracture in the next 10 years 7.5%.  Discussed with patient, she is taking vitamin D.  Will check vitamin D today to further optimize therapy.  Discussed risks and benefits of starting alendronate.  Patient elected to start alendronate at this time.  Will follow-up in 1 month for further management.  Counseled on weight training, other risk factors optimized at this time.  Orders: -     VITAMIN D 25 Hydroxy (Vit-D Deficiency, Fractures) -     Alendronate Sodium; Take 1 tablet (70 mg total) by mouth every 7 (seven) days. Take with a full glass of water on an empty stomach.  Dispense: 4 tablet; Refill: 2  Precepted with Dr. Jennette Kettle.  Return in about 1 month (around 01/05/2023).  Celine Mans, MD [facility]

## 2022-12-06 NOTE — Patient Instructions (Signed)
It was great to see you! Thank you for allowing me to participate in your care!  Our plans for today:  - Please continue taking your Vitamin D and Calcium.  - I will let you know what your vitamin D level is.    Please arrive 15 minutes PRIOR to your next scheduled appointment time! If you do not, this affects OTHER patients' care.  Take care and seek immediate care sooner if you develop any concerns.   Celine Mans, MD, PGY-2 [facility] 3:28 PM 12/06/2022  [facility]

## 2022-12-06 NOTE — Assessment & Plan Note (Addendum)
DEXA scan T-score -2.4 of lumbar spine.  This meets criteria for osteopenia.  FRAX risk of major osteoporotic fracture in the next 10 years 7.5%.  Discussed with patient, she is taking vitamin D.  Will check vitamin D today to further optimize therapy.  Discussed risks and benefits of starting alendronate.  Patient elected to start alendronate at this time.  Will follow-up in 1 month for further management.  Counseled on weight training, other risk factors optimized at this time.

## 2022-12-07 LAB — VITAMIN D 25 HYDROXY (VIT D DEFICIENCY, FRACTURES): Vit D, 25-Hydroxy: 30.2 ng/mL (ref 30.0–100.0)

## 2023-01-11 ENCOUNTER — Ambulatory Visit: Payer: Medicare Other | Admitting: Interventional Cardiology

## 2023-02-20 ENCOUNTER — Ambulatory Visit: Payer: Medicare Other | Admitting: Nurse Practitioner

## 2023-02-20 ENCOUNTER — Ambulatory Visit: Payer: Medicare Other | Admitting: Physician Assistant

## 2023-04-20 ENCOUNTER — Ambulatory Visit: Payer: Medicare Other | Admitting: Physician Assistant

## 2023-05-30 ENCOUNTER — Encounter: Payer: Self-pay | Admitting: *Deleted

## 2023-05-30 NOTE — Progress Notes (Deleted)
 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 female with a PMH of coronary calcifications, HLD, prediabetes, saddle PE 2018, asthma, syncope who presents today for 1 year follow-up.  Barbara Wells was seen initially by Dr. Eldridge Dace on 03/2018 by referral of PCP for evaluation of coronary calcification seen on CT.  She has a PMH of submassive PE that occurred in 03/2017 and was treated with [facility] and is currently no longer on anticoagulation after resolution.  She completed a 2D echo in 2018 that showed EF of 55 to 60% with no RWMA and grade 1 DD with trivial MVR and mild to moderate and PASP of 41 mmHg following TEE.  She was last seen by Dr. Eldridge Dace on 10/06/2021 and reported doing well and continuing to exercise by cycling 3-4 times a week for 30 minutes.  She reported rare left chest pain that occurred in sitting lasting seconds to minutes.  EKG was completed and showed no evidence of ACS and patient had no medication changes made at that time.   During today's visit the patient reports*** .  Patient denies chest pain, palpitations, dyspnea, PND, orthopnea, nausea, vomiting, dizziness, syncope, edema, weight gain, or early satiety.  ***Notes: -Last ischemic evaluation: -Last echo: -Interim ED visits: Review of Systems  Please see the history of present illness.    All other systems reviewed and are otherwise negative except as noted above.  Physical  Exam    Wt Readings from Last 3 Encounters:  12/06/22 185 lb 6 oz (84.1 kg)  06/28/22 172 lb (78 kg)  06/01/22 172 lb (78 kg)   ZO:[id number] were no vitals filed for this visit.,There is no height or weight on file to calculate BMI. GEN: Well nourished, well developed in no acute distress Neck: No JVD; No carotid bruits Pulmonary: Clear to auscultation without rales, wheezing or rhonchi  Cardiovascular: Normal rate. Regular rhythm. Normal S1. Normal S2.   Murmurs: There is no murmur.  ABDOMEN: Soft, non-tender, non-distended EXTREMITIES:  No edema; No deformity   EKG/LABS/ Recent Cardiac Studies   ECG personally reviewed by me today - ***  Risk Assessment/Calculations:   {Does this patient have ATRIAL FIBRILLATION?[phone]}      Lab Results  Component Value Date   WBC 7.4 05/25/2021   HGB 12.6 05/25/2021   HCT 38.5 05/25/2021   MCV 78 (L) 05/25/2021   PLT 225 05/25/2021   Lab Results  Component Value Date   CREATININE 0.61 06/01/2022   BUN 12 06/01/2022   NA 144 06/01/2022   K 4.6 06/01/2022   CL 106 06/01/2022   CO2 24 06/01/2022   Lab Results  Component Value Date   CHOL 114 06/01/2022   HDL 39 (L) 06/01/2022   LDLCALC 55 06/01/2022   TRIG 109 06/01/2022   CHOLHDL 2.9 06/01/2022    Lab Results  Component Value Date   HGBA1C  6.3 (H) 06/01/2022   Assessment & Plan    1.  Coronary calcifications:  2.  History of PE:  3.  HLD:  4.  Prediabetes:      Disposition: Follow-up with Lance Muss, MD or APP in *** months {Are you ordering a CV Procedure (e.g. stress test, cath, DCCV, TEE, etc)?   Press F2        :[id number]}   Signed, Napoleon Form, Leodis Rains, NP 05/30/2023, 10:49 AM [facility]

## 2023-05-31 ENCOUNTER — Ambulatory Visit: Payer: Medicare Other | Attending: Nurse Practitioner | Admitting: Nurse Practitioner

## 2023-05-31 DIAGNOSIS — R7303 Prediabetes: Secondary | ICD-10-CM

## 2023-05-31 DIAGNOSIS — E785 Hyperlipidemia, unspecified: Secondary | ICD-10-CM

## 2023-05-31 DIAGNOSIS — I2699 Other pulmonary embolism without acute cor pulmonale: Secondary | ICD-10-CM

## 2023-05-31 DIAGNOSIS — I2510 Atherosclerotic heart disease of native coronary artery without angina pectoris: Secondary | ICD-10-CM

## 2023-07-04 ENCOUNTER — Ambulatory Visit: Payer: Medicare Other | Admitting: Family Medicine

## 2023-07-16 NOTE — Progress Notes (Deleted)
 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 female with a PMH of coronary calcifications, submassive PE 03/2017, thyroid nodule, prediabetes who presents today for 1 year follow-up.  Barbara Wells seen initially by Dr. Jacquelynn Wells on 04/2017 for evaluation of coronary calcifications seen on CT by request of PCP.  She has a past history of submassive PE on 03/2017 and was treated with [facility].  She had 2D echo completed 2018 and showed EF of 55 to 60% with no RWMA and grade 1 DD with moderate TR and trivial PV.  She was last seen on 10/06/21 for annual follow-up and reported rare left-sided chest pain that last for seconds to minutes and not associated with exercise or shortness of breath.  She had no medication changes made and was continued on atorvastatin.  Patient denies chest pain, palpitations, dyspnea, PND, orthopnea, nausea, vomiting, dizziness, syncope, edema, weight gain, or early satiety.   Discussed the use of AI scribe software for clinical note transcription with the patient, who gave verbal consent to proceed.  History of Present Illness    ***Notes: -Last ischemic evaluation:  Review of Systems  Please see the history of present illness.    All other systems reviewed and are otherwise negative except as noted above.  Physical Exam    Wt Readings from Last 3 Encounters:  12/06/22 185 lb 6 oz (84.1 kg)  06/28/22 172 lb (78 kg)  06/01/22  172 lb (78 kg)   JX:[id number] were no vitals filed for this visit.,There is no height or weight on file to calculate BMI. GEN: Well nourished, well developed in no acute distress Neck: No JVD; No carotid bruits Pulmonary: Clear to auscultation without rales, wheezing or rhonchi  Cardiovascular: Normal rate. Regular rhythm. Normal S1. Normal S2.   Murmurs: There is no murmur.  ABDOMEN: Soft, non-tender, non-distended EXTREMITIES:  No edema; No deformity   EKG/LABS/ Recent Cardiac Studies   ECG personally reviewed by me today - ***  Risk Assessment/Calculations:   {Does this patient have ATRIAL FIBRILLATION?:[phone]}      Lab Results  Component Value Date   WBC 7.4 05/25/2021   HGB 12.6 05/25/2021   HCT 38.5 05/25/2021   MCV 78 (L) 05/25/2021   PLT 225 05/25/2021   Lab Results  Component Value Date   CREATININE 0.61 06/01/2022   BUN 12 06/01/2022   NA 144 06/01/2022   K 4.6 06/01/2022   CL 106 06/01/2022   CO2 24 06/01/2022   Lab Results  Component Value Date   CHOL 114 06/01/2022   HDL 39 (L) 06/01/2022   LDLCALC 55 06/01/2022   TRIG 109 06/01/2022   CHOLHDL 2.9 06/01/2022    Lab Results  Component Value Date   HGBA1C 6.3 (H) 06/01/2022   Assessment & Plan    1.  Coronary calcifications  2.  History of submassive PE  3.  Hyperlipidemia  4.  Prediabetes      Disposition: Follow-up with Avery Bodo, MD or APP in *** months {Are you ordering a CV Procedure (e.g. stress test, cath, DCCV, TEE, etc)?   Press F2        :[id number]}   Signed, Francene Ing, Retha Cast, NP 07/16/2023, 2:10 PM [facility]

## 2023-07-17 ENCOUNTER — Ambulatory Visit: Payer: Medicare Other | Admitting: Nurse Practitioner

## 2023-07-17 DIAGNOSIS — I2510 Atherosclerotic heart disease of native coronary artery without angina pectoris: Secondary | ICD-10-CM

## 2023-07-17 DIAGNOSIS — R7303 Prediabetes: Secondary | ICD-10-CM

## 2023-07-17 DIAGNOSIS — I2699 Other pulmonary embolism without acute cor pulmonale: Secondary | ICD-10-CM

## 2023-07-17 DIAGNOSIS — E785 Hyperlipidemia, unspecified: Secondary | ICD-10-CM

## 2023-08-03 ENCOUNTER — Ambulatory Visit: Payer: Medicare Other

## 2023-08-03 VITALS — Ht 66.0 in | Wt 180.0 lb

## 2023-08-03 DIAGNOSIS — Z0000 Encounter for general adult medical examination without abnormal findings: Secondary | ICD-10-CM

## 2023-08-03 NOTE — Patient Instructions (Addendum)
 Ms. Crews , Thank you for taking time to come for your Medicare Wellness Visit. I appreciate your ongoing commitment to your health goals. Please review the following plan we discussed and let me know if I can assist you in the future.   Referrals/Orders/Follow-Ups/Clinician Recommendations: Yes, keep maintaining your health by keeping your appointments with Dr. Ivin Marrow  and any specialists that you may see.  Call us  if you need anything.  Have a great year!!!!  Recommendations from Nurse Torres Hardenbrook: Aim for 30 minutes of exercise or brisk walking 5 days per week.  Drink 6-8 glasses of water each day. Eat 5-6 servings of fresh vegetables and fruits per day. Continue to do brain exercises such as reading, puzzles, games on your phone to help keep the brain sharp and active.  This is a list of the screening recommended for you and due dates:  Health Maintenance  Topic Date Due   DTaP/Tdap/Td vaccine (1 - Tdap) Never done   Pneumonia Vaccine (1 of 2 - PCV) Never done   Cologuard (Stool DNA test)  Never done   Mammogram  Never done   Zoster (Shingles) Vaccine (1 of 2) Never done   COVID-19 Vaccine (4 - 2024-25 season) 12/04/2022   Flu Shot  11/03/2023   Medicare Annual Wellness Visit  08/02/2024   DEXA scan (bone density measurement)  Completed   Hepatitis C Screening  Completed   HPV Vaccine  Aged Out   Meningitis B Vaccine  Aged Out    Advanced directives: (Declined) Advance directive discussed with you today. Even though you declined this today, please call our office should you change your mind, and we can give you the proper paperwork for you to fill out.  Next Medicare Annual Wellness Visit scheduled for next year: Yes, It was nice speaking with you today! Your next Annual Wellness Visit is scheduled for 08/05/2024 at 8:30 a.m. via PHONE VISIT. If you need to reschedule or cancel, please call [phone]. [facility])

## 2023-08-03 NOTE — Progress Notes (Signed)
 Because this visit was a virtual/telehealth visit,  certain criteria was not obtained, such a blood pressure, CBG if applicable, and timed get up and go. Any medications not marked as "taking" were not mentioned during the medication reconciliation part of the visit. Any vitals not documented were not able to be obtained due to this being a telehealth visit or patient was unable to self-report a recent blood pressure reading due to a lack of equipment at home via telehealth. Vitals that have been documented are verbally provided by the patient.   Subjective:   Barbara Wells is a 68 y.o. who presents for a Medicare Wellness preventive visit.  Visit Complete: Virtual I connected with  Barbara Wells on 08/03/23 by a audio enabled telemedicine application and verified that I am speaking with the correct person using two identifiers.  Patient Location: Home  Provider Location: Office/Clinic  I discussed the limitations of evaluation and management by telemedicine. The patient expressed understanding and agreed to proceed.  Vital Signs: Because this visit was a virtual/telehealth visit, some criteria may be missing or patient reported. Any vitals not documented were not able to be obtained and vitals that have been documented are patient reported.  VideoDeclined- This patient declined Librarian, academic. Therefore the visit was completed with audio only.  Persons Participating in Visit: Patient.  AWV Questionnaire: No: Patient Medicare AWV questionnaire was not completed prior to this visit.  Cardiac Risk Factors include: advanced age (>52men, >61 women);family history of premature cardiovascular disease     Objective:    Today's Vitals   08/03/23 0834  Weight: 180 lb (81.6 kg)  Height: 5\' 6"  (1.676 m)  PainSc: 0-No pain   Body mass index is 29.05 kg/m.     08/03/2023    8:37 AM 12/06/2022    3:04 PM 06/28/2022    2:15 PM 06/01/2022   10:21 AM  08/25/2021    2:32 PM 05/25/2021    2:47 PM 05/18/2020    9:46 AM  Advanced Directives  Does Patient Have a Medical Advance Directive? No No No No No No No  Would patient like information on creating a medical advance directive? No - Patient declined No - Patient declined No - Patient declined No - Patient declined No - Patient declined No - Patient declined No - Patient declined    Current Medications (verified) Outpatient Encounter Medications as of 08/03/2023  Medication Sig   latanoprost  (XALATAN ) 0.005 % ophthalmic solution Place 1 drop into both eyes at bedtime.   Multiple Vitamin (MULTIVITAMIN WITH MINERALS) TABS tablet Take 1 tablet by mouth at bedtime.   ofloxacin (OCUFLOX) 0.3 % ophthalmic solution Place into the left eye.   acetaminophen (TYLENOL) 500 MG tablet Take 1,000 mg by mouth every 6 (six) hours as needed for headache (pain).  (Patient not taking: Reported on 08/03/2023)   albuterol  (VENTOLIN  HFA) 108 (90 Base) MCG/ACT inhaler Inhale 2 puffs into the lungs every 6 (six) hours as needed for wheezing or shortness of breath. (Patient not taking: Reported on 08/03/2023)   alendronate  (FOSAMAX ) 70 MG tablet Take 1 tablet (70 mg total) by mouth every 7 (seven) days. Take with a full glass of water on an empty stomach. (Patient not taking: Reported on 08/03/2023)   aspirin  EC 81 MG tablet Take 1 tablet (81 mg total) by mouth daily. (Patient not taking: Reported on 08/03/2023)   atorvastatin  (LIPITOR) 40 MG tablet Take 1 tablet by mouth once daily (Patient not  taking: Reported on 08/03/2023)   carbamide peroxide (DEBROX) 6.5 % OTIC solution Place 5 drops into the right ear 2 (two) times daily. Use for 4 days as needed (Patient not taking: Reported on 08/03/2023)   ibuprofen (ADVIL,MOTRIN) 200 MG tablet Take 200 mg by mouth every 6 (six) hours as needed for headache (pain). (Patient not taking: Reported on 08/03/2023)   ipratropium (ATROVENT ) 0.03 % nasal spray Place 2 sprays into both nostrils every 12  (twelve) hours. (Patient not taking: Reported on 08/03/2023)   melatonin 3 MG TABS tablet Take 1 tablet (3 mg total) by mouth at bedtime as needed. (Patient not taking: Reported on 08/03/2023)   No facility-administered encounter medications on file as of 08/03/2023.    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 No pertinent surgical history. Family History  Problem Relation Age of Onset   Stroke Mother    Hypertension Mother    Heart disease Father    Kidney disease Brother    Hernia Son    ADD / ADHD Son    Social History   Socioeconomic History   Marital status: Single    Spouse name: Not on file   Number of children: Not on file   Years of education: Not on file   Highest education level: Not on file  Occupational History   Not on file  Tobacco Use   Smoking status: Never   Smokeless tobacco: Never  Substance and Sexual Activity   Alcohol use: No   Drug use: No   Sexual activity: Never  Other Topics Concern   Not on file  Social History Narrative   Not on file   Social Drivers of Health   Financial Resource Strain: Low Risk  (08/03/2023)   Overall Financial Resource Strain (CARDIA)    Difficulty of Paying Living Expenses: Not hard at all  Food Insecurity: No Food Insecurity (08/03/2023)   Hunger Vital Sign    Worried About Running Out of Food in the Last Year: Never true    Ran Out of Food in the Last Year: Never true  Transportation Needs: No Transportation Needs (08/03/2023)   PRAPARE - Administrator, Civil Service (Medical): No    Lack of Transportation (Non-Medical): No  Physical Activity: Sufficiently Active (08/03/2023)   Exercise Vital Sign     Days of Exercise per Week: 5 days    Minutes of Exercise per Session: 60 min  Stress: No Stress Concern Present (08/03/2023)   [employer] of Occupational Health - Occupational Stress Questionnaire    Feeling of Stress : Not at all  Social Connections: Unknown (08/03/2023)   Social Connection and Isolation Panel [NHANES]    Frequency of Communication with Friends and Family: More than three times a week    Frequency of Social Gatherings with Friends and Family: More than three times a week    Attends Religious Services: More than 4 times per year    Active Member of [employer] or Organizations: Yes    Attends Banker Meetings: More than 4 times per year    Marital Status: Patient declined  Tobacco Counseling Counseling given: Not Answered    Clinical Intake:  Pre-visit preparation completed: Yes  Pain : No/denies pain Pain Score: 0-No pain     BMI - recorded: 29.05 Nutritional Status: BMI 25 -29 Overweight Nutritional Risks: None Diabetes: No  Lab Results  Component Value Date   HGBA1C 6.3 (H) 06/01/2022   HGBA1C 6.0 (H) 08/25/2021   HGBA1C 6.5 (H) 05/25/2021     How often do you need to have someone help you when you read instructions, pamphlets, or other written materials from your doctor or pharmacy?: 1 - Never  Interpreter Needed?: No  Information entered by :: Deane Wattenbarger N. Marbeth Smedley, LPN.   Activities of Daily Living     08/03/2023    8:40 AM  In your present state of health, do you have any difficulty performing the following activities:  Hearing? 0  Vision? 0  Difficulty concentrating or making decisions? 0  Comment READ BIBLE EVERYDAY  Walking or climbing stairs? 0  Dressing or bathing? 0  Doing errands, shopping? 0  Preparing Food and eating ? N  Using the Toilet? N  In the past six months, have you accidently leaked urine? N  Do you have problems with loss of bowel control? N  Managing your Medications? N  Managing your Finances? N   Housekeeping or managing your Housekeeping? N    Patient Care Team: Ivin Marrow, MD as PCP - General (Family Medicine) Lucendia Rusk, MD as PCP - Cardiology (Cardiology) Jearline Minder, MD as Consulting Physician (Ophthalmology) [facility] as Consulting Physician (Ophthalmology)  Indicate any recent Medical Services you may have received from other than [facility] providers in the past year (date may be approximate).     Assessment:   This is a routine wellness examination for Barbara Wells.  Hearing/Vision screen Hearing Screening - Comments:: Denies hearing difficulties.  Vision Screening - Comments:: Wears rx glasses - up to date with routine eye exams with [facility] and Dr. Deetta Farrow (Retina Specialist)    Goals Addressed   None    Depression Screen     08/03/2023    8:40 AM 12/06/2022    3:16 PM 06/28/2022    2:09 PM 06/01/2022   10:20 AM 08/25/2021    2:33 PM 05/18/2020    9:43 AM 03/06/2019   10:40 AM  PHQ 2/9 Scores  PHQ - 2 Score 0 0 0 1 0 0 0  PHQ- 9 Score 3 3 0 4 1 0     Fall Risk     08/03/2023    8:37 AM 12/06/2022    3:05 PM 06/28/2022    2:14 PM 06/01/2022   10:20 AM 08/25/2021    2:33 PM  Fall Risk   Falls in the past year? 1 1 0 0 0  Number falls in past yr: 0 0 0 0 0  Injury with Fall? 0 0 0 0 0  Risk for fall due to :   No Fall Risks    Follow up Falls evaluation completed;Falls prevention discussed;Education provided  Falls prevention discussed      MEDICARE RISK AT HOME:  Medicare Risk at Home Any stairs in or around the home?: Yes If so, are there any without handrails?: No Home free of loose throw rugs in walkways, pet beds, electrical cords, etc?: Yes Adequate lighting in your home to reduce risk of falls?: Yes Life alert?: No Use of a cane, walker or w/c?: No Grab bars in the bathroom?:  Yes (SHOWER) Shower chair or bench in shower?: Yes Elevated toilet seat or a handicapped toilet?: No  TIMED UP AND GO:  Was  the test performed?  No  Cognitive Function: 6CIT completed    08/03/2023    8:40 AM  MMSE - Mini Mental State Exam  Not completed: Unable to complete        08/03/2023    8:37 AM 06/28/2022    2:15 PM  6CIT Screen  What Year? 0 points 0 points  What month? 0 points 0 points  What time? 0 points 0 points  Count back from 20 0 points 0 points  Months in reverse 0 points 0 points  Repeat phrase 0 points 0 points  Total Score 0 points 0 points    Immunizations Immunization History  Administered Date(s) Administered   PFIZER Comirnaty(Gray Top)Covid-19 Tri-Sucrose Vaccine 05/22/2020   PFIZER(Purple Top)SARS-COV-2 Vaccination 05/26/2019, 06/19/2019    Screening Tests Health Maintenance  Topic Date Due   DTaP/Tdap/Td (1 - Tdap) Never done   Pneumonia Vaccine 26+ Years old (1 of 2 - PCV) Never done   Fecal DNA (Cologuard)  Never done   MAMMOGRAM  Never done   Zoster Vaccines- Shingrix (1 of 2) Never done   COVID-19 Vaccine (4 - 2024-25 season) 12/04/2022   INFLUENZA VACCINE  11/03/2023   Medicare Annual Wellness (AWV)  08/02/2024   DEXA SCAN  Completed   Hepatitis C Screening  Completed   HPV VACCINES  Aged Out   Meningococcal B Vaccine  Aged Out    Health Maintenance  Health Maintenance Due  Topic Date Due   DTaP/Tdap/Td (1 - Tdap) Never done   Pneumonia Vaccine 17+ Years old (1 of 2 - PCV) Never done   Fecal DNA (Cologuard)  Never done   MAMMOGRAM  Never done   Zoster Vaccines- Shingrix (1 of 2) Never done   COVID-19 Vaccine (4 - 2024-25 season) 12/04/2022   Health Maintenance Items Addressed: Yes Patient refused vaccines.  Patient is due for Mammogram and Cologuard.  Additional Screening:  Vision Screening: Recommended annual ophthalmology exams for early detection of glaucoma and other disorders of the eye.  Dental Screening: Recommended annual dental exams for proper oral hygiene  Community Resource Referral / Chronic Care Management: CRR required this  visit?  No   CCM required this visit?  No     Plan:     I have personally reviewed and noted the following in the patient's chart:   Medical and social history Use of alcohol, tobacco or illicit drugs  Current medications and supplements including opioid prescriptions. Patient is not currently taking opioid prescriptions. Functional ability and status Nutritional status Physical activity Advanced directives List of other physicians Hospitalizations, surgeries, and ER visits in previous 12 months Vitals Screenings to include cognitive, depression, and falls Referrals and appointments  In addition, I have reviewed and discussed with patient certain preventive protocols, quality metrics, and best practice recommendations. A written personalized care plan for preventive services as well as general preventive health recommendations were provided to patient.     Margette Sheldon, LPN   11/05/6960   After Visit Summary: (MyChart) Due to this being a telephonic visit, the after visit summary with patients personalized plan was offered to patient via MyChart   Notes: PCP Follow Up Recommendations: Patient is due for Mammogram and Cologuard.

## 2023-08-03 NOTE — Progress Notes (Signed)
 Spoke with patient to schedule care gaps appt. Patient stated that she will call back in June. Due to her being book up with appointments in May. Barbara Wells, CMA

## 2023-08-09 NOTE — Progress Notes (Unsigned)
 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 female with a PMH of coronary calcifications, submassive PE 03/2017, thyroid  nodule, prediabetes who presents today for 1 year follow-up.  Barbara Wells seen initially by Dr. Jacquelynn Matter on 04/2017 for evaluation of coronary calcifications seen on CT by request of PCP.  She has a past history of submassive PE on 03/2017 and was treated with [facility].  She had 2D echo completed 2018 and showed EF of 55 to 60% with no RWMA and grade 1 DD with moderate TR and trivial PV.  She was last seen on 10/06/21 for annual follow-up and reported rare left-sided chest pain that last for seconds to minutes and not associated with exercise or shortness of breath.  She had no medication changes made and was continued on atorvastatin .  Patient denies chest pain, palpitations, dyspnea, PND, orthopnea, nausea, vomiting, dizziness, syncope, edema, weight gain, or early satiety.   Discussed the use of AI scribe software for clinical note transcription with the patient, who gave verbal consent to proceed.  History of Present Illness    ***Notes: -Last ischemic evaluation:  Review of Systems  Please see the history of present illness.    All other systems reviewed and are otherwise negative except as noted above.  Physical Exam    Wt Readings from Last 3 Encounters:  08/03/23 180 lb (81.6 kg)  12/06/22 185 lb 6 oz (84.1 kg)  06/28/22  172 lb (78 kg)   IO:NGEXB were no vitals filed for this visit.,There is no height or weight on file to calculate BMI. GEN: Well nourished, well developed in no acute distress Neck: No JVD; No carotid bruits Pulmonary: Clear to auscultation without rales, wheezing or rhonchi  Cardiovascular: Normal rate. Regular rhythm. Normal S1. Normal S2.   Murmurs: There is no murmur.  ABDOMEN: Soft, non-tender, non-distended EXTREMITIES:  No edema; No deformity   EKG/LABS/ Recent Cardiac Studies   ECG personally reviewed by me today - ***  Risk Assessment/Calculations:   {Does this patient have ATRIAL FIBRILLATION?[phone]}      Lab Results  Component Value Date   WBC 7.4 05/25/2021   HGB 12.6 05/25/2021   HCT 38.5 05/25/2021   MCV 78 (L) 05/25/2021   PLT 225 05/25/2021   Lab Results  Component Value Date   CREATININE 0.61 06/01/2022   BUN 12 06/01/2022   NA 144 06/01/2022   K 4.6 06/01/2022   CL 106 06/01/2022   CO2 24 06/01/2022   Lab Results  Component Value Date   CHOL 114 06/01/2022   HDL 39 (L) 06/01/2022   LDLCALC 55 06/01/2022   TRIG 109 06/01/2022   CHOLHDL 2.9 06/01/2022    Lab Results  Component Value Date   HGBA1C 6.3 (H) 06/01/2022   Assessment & Plan    1.  Coronary calcifications  2.  History of submassive PE  3.  Hyperlipidemia  4.  Prediabetes      Disposition: Follow-up with Avery Bodo, MD or APP in *** months {Are you ordering a CV Procedure (e.g. stress test, cath, DCCV, TEE, etc)?   Press F2        :[id number]}   Signed, Francene Ing, Retha Cast, NP 08/09/2023, 12:09 PM [facility]

## 2023-08-10 ENCOUNTER — Ambulatory Visit: Attending: Nurse Practitioner | Admitting: Nurse Practitioner

## 2023-08-10 ENCOUNTER — Encounter: Payer: Self-pay | Admitting: Nurse Practitioner

## 2023-08-10 VITALS — BP 102/58 | HR 80 | Ht 66.0 in | Wt 186.6 lb

## 2023-08-10 DIAGNOSIS — I2699 Other pulmonary embolism without acute cor pulmonale: Secondary | ICD-10-CM | POA: Diagnosis not present

## 2023-08-10 DIAGNOSIS — R7303 Prediabetes: Secondary | ICD-10-CM

## 2023-08-10 DIAGNOSIS — E785 Hyperlipidemia, unspecified: Secondary | ICD-10-CM

## 2023-08-10 DIAGNOSIS — I2510 Atherosclerotic heart disease of native coronary artery without angina pectoris: Secondary | ICD-10-CM

## 2023-08-10 DIAGNOSIS — I071 Rheumatic tricuspid insufficiency: Secondary | ICD-10-CM

## 2023-08-10 MED ORDER — ASPIRIN EC 81 MG PO TBEC: 81.0000 mg | DELAYED_RELEASE_TABLET | Freq: Every day | ORAL | 3 refills | Status: AC

## 2023-08-10 NOTE — Patient Instructions (Addendum)
 Medication Instructions:  Your physician recommends that you continue on your current medications as directed. Please refer to the Current Medication list given to you today.  **RESUME taking aspirin  81 mg daily  *If you need a refill on your cardiac medications before your next appointment, please call your pharmacy*  Lab Work: Next week: Lipid panel and LFTs If you have labs (blood work) drawn today and your tests are completely normal, you will receive your results only by: MyChart Message (if you have MyChart) OR A paper copy in the mail If you have any lab test that is abnormal or we need to change your treatment, we will call you to review the results.  Testing/Procedures: Your physician has requested that you have an echocardiogram. Echocardiography is a painless test that uses sound waves to create images of your heart. It provides your doctor with information about the size and shape of your heart and how well your heart's chambers and valves are working. This procedure takes approximately one hour. There are no restrictions for this procedure. Please do NOT wear cologne, perfume, aftershave, or lotions (deodorant is allowed). Please arrive 15 minutes prior to your appointment time.  Please note: We ask at that you not bring children with you during ultrasound (echo/ vascular) testing. Due to room size and safety concerns, children are not allowed in the ultrasound rooms during exams. Our front office staff cannot provide observation of children in our lobby area while testing is being conducted. An adult accompanying a patient to their appointment will only be allowed in the ultrasound room at the discretion of the ultrasound technician under special circumstances. We apologize for any inconvenience.  Follow-Up: At [facility], you and your health needs are our priority.  As part of our continuing mission to provide you with exceptional heart care, our providers are all part  of one team.  This team includes your primary Cardiologist (physician) and Advanced Practice Providers or APPs (Physician Assistants and Nurse Practitioners) who all work together to provide you with the care you need, when you need it.  Your next appointment:   6 month(s)  The format for your next appointment:   In Person  Provider:   Christena Covert, MD{  We recommend signing up for the patient portal called "MyChart".  Sign up information is provided on this After Visit Summary.  MyChart is used to connect with patients for Virtual Visits (Telemedicine).  Patients are able to view lab/test results, encounter notes, upcoming appointments, etc.  Non-urgent messages can be sent to your provider as well.   To learn more about what you can do with MyChart, go to [email or web address].

## 2023-09-07 ENCOUNTER — Ambulatory Visit: Payer: Self-pay | Admitting: Nurse Practitioner

## 2023-09-07 LAB — LIPID PANEL
Chol/HDL Ratio: 4.1 ratio (ref 0.0–4.4)
Cholesterol, Total: 170 mg/dL (ref 100–199)
HDL: 41 mg/dL (ref >39)
LDL Chol Calc (NIH): 110 mg/dL — ABNORMAL HIGH (ref 0–99)
Triglycerides: 103 mg/dL (ref 0–149)
VLDL Cholesterol Cal: 19 mg/dL (ref 5–40)

## 2023-09-07 LAB — HEPATIC FUNCTION PANEL
ALT: 18 IU/L (ref 0–32)
AST: 18 IU/L (ref 0–40)
Albumin: 4.5 g/dL (ref 3.9–4.9)
Alkaline Phosphatase: 128 IU/L — ABNORMAL HIGH (ref 44–121)
Bilirubin Total: 0.4 mg/dL (ref 0.0–1.2)
Bilirubin, Direct: 0.15 mg/dL (ref 0.00–0.40)
Total Protein: 6.1 g/dL (ref 6.0–8.5)

## 2023-09-11 ENCOUNTER — Telehealth: Payer: Self-pay | Admitting: Nurse Practitioner

## 2023-09-11 DIAGNOSIS — E785 Hyperlipidemia, unspecified: Secondary | ICD-10-CM

## 2023-09-11 MED ORDER — ATORVASTATIN CALCIUM 40 MG PO TABS: 40.0000 mg | ORAL_TABLET | Freq: Every day | ORAL | 3 refills | Status: AC

## 2023-09-11 NOTE — Telephone Encounter (Signed)
*  STAT* If patient is at the pharmacy, call can be transferred to refill team.   1. Which medications need to be refilled? (please list name of each medication and dose if known)   atorvastatin  (LIPITOR) 40 MG tablet    4. Which pharmacy/location (including street and city if local pharmacy) is medication to be sent to?  [id number] - [place], [place] - [address]     5. Do they need a 30 day or 90 day supply? 90  Pt requested if Renelda Carry, NP can fill.

## 2023-09-11 NOTE — Telephone Encounter (Signed)
 Pt's medication was sent to pt's pharmacy as requested. Confirmation received.

## 2023-09-15 ENCOUNTER — Ambulatory Visit (HOSPITAL_COMMUNITY)

## 2023-10-31 ENCOUNTER — Telehealth (HOSPITAL_COMMUNITY): Payer: Self-pay | Admitting: Nurse Practitioner

## 2023-10-31 NOTE — Telephone Encounter (Signed)
 Patient cancelled Echocardiogram x 2 and will call the office when she is ready to reschedule. Order will be removed from the active echo WQ. Thank you.

## 2023-11-01 ENCOUNTER — Ambulatory Visit (HOSPITAL_COMMUNITY)

## 2023-12-19 ENCOUNTER — Encounter: Admitting: Family Medicine

## 2024-01-03 ENCOUNTER — Encounter: Admitting: Family Medicine

## 2024-01-08 ENCOUNTER — Encounter: Admitting: Family Medicine

## 2024-01-15 ENCOUNTER — Ambulatory Visit: Admitting: Family Medicine

## 2024-01-15 VITALS — BP 105/61 | HR 71 | Ht 65.0 in | Wt 177.6 lb

## 2024-01-15 DIAGNOSIS — R7303 Prediabetes: Secondary | ICD-10-CM

## 2024-01-15 DIAGNOSIS — Z1231 Encounter for screening mammogram for malignant neoplasm of breast: Secondary | ICD-10-CM

## 2024-01-15 DIAGNOSIS — M8588 Other specified disorders of bone density and structure, other site: Secondary | ICD-10-CM | POA: Diagnosis not present

## 2024-01-15 DIAGNOSIS — Z0000 Encounter for general adult medical examination without abnormal findings: Secondary | ICD-10-CM

## 2024-01-15 LAB — POCT GLYCOSYLATED HEMOGLOBIN (HGB A1C): HbA1c, POC (prediabetic range): 6.4 % (ref 5.7–6.4)

## 2024-01-15 MED ORDER — ALENDRONATE SODIUM 70 MG PO TABS: 70.0000 mg | ORAL_TABLET | ORAL | 2 refills | Status: AC

## 2024-01-15 NOTE — Assessment & Plan Note (Signed)
 Osteopenia previously managed with Fosamax , currently not taking due to not requesting refill. - Prescribe Fosamax  once a week with a full glass of water on an empty stomach. - Check calcium  levels, CMP - Encourage continued use of over-the-counter calcium  and vitamin D  supplements.

## 2024-01-15 NOTE — Assessment & Plan Note (Addendum)
 A1c improved today to 6.4 from 6.3. Continue vitamin D .

## 2024-01-15 NOTE — Patient Instructions (Addendum)
 It was great to see you! Thank you for allowing me to participate in your care!  Our plans for today:  - Please make sure to get your mammogram done over at the GI and breast imaging center. - Please do your Cologuard and send in the sample. - If you change your mind about the flu vaccine or other vaccines please let us  know. - I will let you know the results of your A1c and other labs today. - Please restart your Fosamax  I have refilled this medication.  You will take this once weekly on an empty stomach with a full glass of water.    Please arrive 15 minutes PRIOR to your next scheduled appointment time! If you do not, this affects OTHER patients' care.  Take care and seek immediate care sooner if you develop any concerns.   Ozell Provencal, MD, PGY-3 [facility] 11:16 AM 01/15/2024  [facility]

## 2024-01-15 NOTE — Progress Notes (Signed)
    SUBJECTIVE:   CHIEF COMPLAINT / HPI: physical  Discussed the use of AI scribe software for clinical note transcription with the patient, who gave verbal consent to proceed.  History of Present Illness Barbara Wells is a 68 year old female who presents for an annual physical exam.  Antiplatelet therapy and perioperative bleeding risk - Takes daily aspirin  therapy. - Concerned about increased bleeding risk due to upcoming dental procedure involving extraction of approximately ten teeth. - Questions safety of continuing aspirin  prior to dental extraction.  Annual wellness - Relies on her son for transportation to appointments. - Requires assistance to complete Cologuard test kit at home. - Plans to schedule a mammogram when transportation is available.  Osteoporosis management - Diagnosed with osteoporosis. - Not currently taking Fosamax  due to running out of prescription. - Takes over-the-counter calcium  and vitamin D  supplements ([employer] brand).  Immunization refusal - Declines flu, pneumonia, and shingles vaccines today due to previous adverse reactions.   PERTINENT  PMH / PSH: Asthma, obesity, CAD, hyperlipidemia, prediabetes  OBJECTIVE:   BP 105/61   Pulse 71   Ht 5' 5 (1.651 m)   Wt 177 lb 9.6 oz (80.6 kg)   SpO2 99%   BMI 29.55 kg/m   Physical Exam General: A&O, well appearing HEENT: No sign of trauma, EOM grossly intact, moist mucous membranes Cardiac: RRR, no m/r/g Respiratory: CTAB, normal WOB, no w/c/r GI: Soft, NTTP, non-distended, no rebound or guarding Extremities: NTTP, no peripheral edema. Neuro: Moves all four extremities appropriately. Psych: Appropriate mood and affect    ASSESSMENT/PLAN:   Assessment & Plan Annual physical exam Encounter for screening mammogram for malignant neoplasm of breast Annual wellness visit conducted. She is legally blind, affecting task completion. - Mammogram ordered - Colonoscopy declined -  Encourage completion of Cologuard test with assistance. Declined flu, pneumonia, and shingles vaccines due to previous adverse reactions. - Respect her decision to defer vaccines. - Offer vaccines at future visits if she is willing. - Lipid panel - CBC w/ diff Prediabetes A1c improved today to 6.4 from 6.3. Continue vitamin D . Osteopenia of lumbar spine Osteopenia previously managed with Fosamax , currently not taking due to not requesting refill. - Prescribe Fosamax  once a week with a full glass of water on an empty stomach. - Check calcium  levels, CMP - Encourage continued use of over-the-counter calcium  and vitamin D  supplements.  Dental procedure - Will review form provided by patient - Depending on extent of tooth extraction may require stopping aspirin  several days prior to procedure  Return in about 3 months (around 04/16/2024) for f/u.  Precepted with Dr. Teressa.  Ozell Provencal, MD, PGY-3 [facility] 12:41 PM 01/15/2024  [facility]

## 2024-01-16 ENCOUNTER — Ambulatory Visit: Payer: Self-pay | Admitting: Family Medicine

## 2024-01-16 LAB — COMPREHENSIVE METABOLIC PANEL WITH GFR
ALT: 24 IU/L (ref 0–32)
AST: 21 IU/L (ref 0–40)
Albumin: 4.6 g/dL (ref 3.9–4.9)
Alkaline Phosphatase: 138 IU/L — ABNORMAL HIGH (ref 49–135)
BUN/Creatinine Ratio: 23 (ref 12–28)
BUN: 16 mg/dL (ref 8–27)
Bilirubin Total: 0.5 mg/dL (ref 0.0–1.2)
CO2: 22 mmol/L (ref 20–29)
Calcium: 9.8 mg/dL (ref 8.7–10.3)
Chloride: 106 mmol/L (ref 96–106)
Creatinine, Ser: 0.69 mg/dL (ref 0.57–1.00)
Globulin, Total: 1.8 g/dL (ref 1.5–4.5)
Glucose: 88 mg/dL (ref 70–99)
Potassium: 4.8 mmol/L (ref 3.5–5.2)
Sodium: 144 mmol/L (ref 134–144)
Total Protein: 6.4 g/dL (ref 6.0–8.5)
eGFR: 94 mL/min/1.73 (ref >59)

## 2024-01-16 LAB — CBC WITH DIFFERENTIAL/PLATELET
Basophils Absolute: 0 x10E3/uL (ref 0.0–0.2)
Basos: 0 %
EOS (ABSOLUTE): 0 x10E3/uL (ref 0.0–0.4)
Eos: 0 %
Hematocrit: 41.4 % (ref 34.0–46.6)
Hemoglobin: 13.4 g/dL (ref 11.1–15.9)
Immature Grans (Abs): 0 x10E3/uL (ref 0.0–0.1)
Immature Granulocytes: 0 %
Lymphocytes Absolute: 2.1 x10E3/uL (ref 0.7–3.1)
Lymphs: 31 %
MCH: 27 pg (ref 26.6–33.0)
MCHC: 32.4 g/dL (ref 31.5–35.7)
MCV: 83 fL (ref 79–97)
Monocytes Absolute: 0.5 x10E3/uL (ref 0.1–0.9)
Monocytes: 7 %
Neutrophils Absolute: 4.1 x10E3/uL (ref 1.4–7.0)
Neutrophils: 62 %
Platelets: 194 x10E3/uL (ref 150–450)
RBC: 4.97 x10E6/uL (ref 3.77–5.28)
RDW: 13.6 % (ref 11.7–15.4)
WBC: 6.7 x10E3/uL (ref 3.4–10.8)

## 2024-01-16 LAB — LIPID PANEL
Chol/HDL Ratio: 2.9 ratio (ref 0.0–4.4)
Cholesterol, Total: 120 mg/dL (ref 100–199)
HDL: 42 mg/dL (ref >39)
LDL Chol Calc (NIH): 63 mg/dL (ref 0–99)
Triglycerides: 74 mg/dL (ref 0–149)
VLDL Cholesterol Cal: 15 mg/dL (ref 5–40)

## 2024-01-17 ENCOUNTER — Telehealth: Payer: Self-pay | Admitting: Family Medicine

## 2024-01-17 NOTE — Telephone Encounter (Signed)
 Called patient regarding dropped off forms for dental surgery clearance. Left HIPAA compliant voicemail. Attempt #1.

## 2024-01-24 NOTE — Telephone Encounter (Signed)
 Called patient regarding dental paperwork that was dropped off at last visit.  Recommend that she hold her aspirin  for 5 days for tooth extraction.  Patient verbalized understanding.  Patient gave permission to fax dental paperwork over to her dentist office.

## 2024-01-26 ENCOUNTER — Other Ambulatory Visit: Payer: Self-pay

## 2024-01-26 DIAGNOSIS — J45909 Unspecified asthma, uncomplicated: Secondary | ICD-10-CM

## 2024-01-26 MED ORDER — ALBUTEROL SULFATE HFA 108 (90 BASE) MCG/ACT IN AERS: 2.0000 | INHALATION_SPRAY | Freq: Four times a day (QID) | RESPIRATORY_TRACT | 2 refills | Status: AC | PRN

## 2024-02-14 ENCOUNTER — Other Ambulatory Visit: Payer: Self-pay

## 2024-02-14 ENCOUNTER — Ambulatory Visit (HOSPITAL_COMMUNITY): Admitting: Anesthesiology

## 2024-02-14 ENCOUNTER — Ambulatory Visit (HOSPITAL_COMMUNITY)
Admission: AD | Admit: 2024-02-14 | Discharge: 2024-02-14 | Disposition: A | Discharge status: Home / Self Care | Source: Ambulatory Visit | Attending: Ophthalmology | Admitting: Ophthalmology

## 2024-02-14 ENCOUNTER — Encounter (HOSPITAL_COMMUNITY): Payer: Self-pay | Admitting: Ophthalmology

## 2024-02-14 ENCOUNTER — Encounter (HOSPITAL_COMMUNITY)
Admission: AD | Disposition: A | Discharge status: Home / Self Care | Payer: Self-pay | Source: Ambulatory Visit | Attending: Ophthalmology

## 2024-02-14 DIAGNOSIS — J45909 Unspecified asthma, uncomplicated: Secondary | ICD-10-CM

## 2024-02-14 DIAGNOSIS — H43392 Other vitreous opacities, left eye: Secondary | ICD-10-CM | POA: Diagnosis not present

## 2024-02-14 DIAGNOSIS — H44002 Unspecified purulent endophthalmitis, left eye: Secondary | ICD-10-CM

## 2024-02-14 DIAGNOSIS — I2510 Atherosclerotic heart disease of native coronary artery without angina pectoris: Secondary | ICD-10-CM

## 2024-02-14 HISTORY — PX: PARS PLANA VITRECTOMY: SHX2166

## 2024-02-14 SURGERY — PARS PLANA VITRECTOMY 25 GAUGE FOR ENDOPHTHALMITIS
Anesthesia: Monitor Anesthesia Care | Site: Eye | Laterality: Left

## 2024-02-14 MED ORDER — TOBRAMYCIN-DEXAMETHASONE 0.3-0.1 % OP OINT
TOPICAL_OINTMENT | OPHTHALMIC | Status: AC
  Filled 2024-02-14: qty 3.5

## 2024-02-14 MED ORDER — CEFAZOLIN SUBCONJUNCTIVAL INJECTION 100 MG/0.5 ML
100.0000 mg | INJECTION | SUBCONJUNCTIVAL | Status: AC
  Administered 2024-02-14: 100 mg via SUBCONJUNCTIVAL
  Filled 2024-02-14: qty 1

## 2024-02-14 MED ORDER — ATROPINE SULFATE 1 % OP SOLN
OPHTHALMIC | Status: AC
  Filled 2024-02-14: qty 5

## 2024-02-14 MED ORDER — PROPARACAINE HCL 0.5 % OP SOLN
1.0000 [drp] | OPHTHALMIC | Status: AC | PRN
  Administered 2024-02-14 (×3): 1 [drp] via OPHTHALMIC
  Filled 2024-02-14: qty 15

## 2024-02-14 MED ORDER — OFLOXACIN 0.3 % OP SOLN
1.0000 [drp] | OPHTHALMIC | Status: AC | PRN
  Administered 2024-02-14 (×3): 1 [drp] via OPHTHALMIC
  Filled 2024-02-14: qty 5

## 2024-02-14 MED ORDER — BUPIVACAINE HCL (PF) 0.75 % IJ SOLN
INTRAMUSCULAR | Status: AC
  Filled 2024-02-14: qty 10

## 2024-02-14 MED ORDER — TROPICAMIDE 1 % OP SOLN
1.0000 [drp] | OPHTHALMIC | Status: AC | PRN
  Administered 2024-02-14 (×3): 1 [drp] via OPHTHALMIC
  Filled 2024-02-14: qty 15

## 2024-02-14 MED ORDER — VANCOMYCIN INTRAVITREAL INJECTION 1 MG/0.1 ML
5.0000 mg | INTRAOCULAR | Status: DC
  Filled 2024-02-14: qty 0.5

## 2024-02-14 MED ORDER — VANCOMYCIN INTRAVITREAL INJECTION 1 MG/0.1 ML
INTRAOCULAR | Status: DC | PRN
  Administered 2024-02-14: 5 mg via INTRAVITREAL

## 2024-02-14 MED ORDER — TETRACAINE HCL 0.5 % OP SOLN
OPHTHALMIC | Status: AC
  Filled 2024-02-14: qty 4

## 2024-02-14 MED ORDER — SODIUM HYALURONATE 10 MG/ML IO SOLUTION
PREFILLED_SYRINGE | INTRAOCULAR | Status: DC | PRN
  Administered 2024-02-14: .85 mL via INTRAOCULAR

## 2024-02-14 MED ORDER — OXYCODONE HCL 5 MG PO TABS: 5.0000 mg | ORAL_TABLET | Freq: Once | ORAL | Status: DC | PRN

## 2024-02-14 MED ORDER — FENTANYL CITRATE (PF) 100 MCG/2ML IJ SOLN: 25.0000 ug | INTRAMUSCULAR | Status: DC | PRN

## 2024-02-14 MED ORDER — ORAL CARE MOUTH RINSE: 15.0000 mL | Freq: Once | OROMUCOSAL | Status: AC

## 2024-02-14 MED ORDER — CEFTAZIDIME INTRAVITREAL INJECTION 2.25 MG/0.1 ML
INTRAVITREAL | Status: DC | PRN
  Administered 2024-02-14: 11.25 mg via INTRAVITREAL

## 2024-02-14 MED ORDER — LIDOCAINE 2% (20 MG/ML) 5 ML SYRINGE
INTRAMUSCULAR | Status: DC | PRN
Start: 2024-02-14 — End: 2024-02-14
  Administered 2024-02-14: 40 mg via INTRAVENOUS

## 2024-02-14 MED ORDER — CEFTAZIDIME INTRAVITREAL INJECTION 2.25 MG/0.1 ML
11.2500 mg | INTRAVITREAL | Status: DC
Start: 2024-02-14 — End: 2024-02-15
  Filled 2024-02-14 (×2): qty 0.5

## 2024-02-14 MED ORDER — BSS IO SOLN
INTRAOCULAR | Status: AC
Start: 2024-02-14 — End: 2024-02-14
  Filled 2024-02-14: qty 15

## 2024-02-14 MED ORDER — EPINEPHRINE PF 1 MG/ML IJ SOLN
INTRAMUSCULAR | Status: AC
  Filled 2024-02-14: qty 1

## 2024-02-14 MED ORDER — ONDANSETRON HCL 4 MG/2ML IJ SOLN
INTRAMUSCULAR | Status: DC | PRN
Start: 2024-02-14 — End: 2024-02-14
  Administered 2024-02-14: 4 mg via INTRAVENOUS

## 2024-02-14 MED ORDER — DEXAMETHASONE SOD PHOSPHATE PF 10 MG/ML IJ SOLN
INTRAMUSCULAR | Status: DC | PRN
  Administered 2024-02-14: 5 mg via INTRAVENOUS

## 2024-02-14 MED ORDER — ONDANSETRON HCL 4 MG/2ML IJ SOLN: 4.0000 mg | Freq: Once | INTRAMUSCULAR | Status: DC | PRN

## 2024-02-14 MED ORDER — FENTANYL CITRATE (PF) 100 MCG/2ML IJ SOLN
INTRAMUSCULAR | Status: AC
  Filled 2024-02-14: qty 2

## 2024-02-14 MED ORDER — ACETAMINOPHEN 10 MG/ML IV SOLN
1000.0000 mg | Freq: Once | INTRAVENOUS | Status: DC | PRN
Start: 2024-02-14 — End: 2024-02-15

## 2024-02-14 MED ORDER — TOBRAMYCIN-DEXAMETHASONE 0.3-0.1 % OP OINT
TOPICAL_OINTMENT | OPHTHALMIC | Status: DC | PRN
Start: 2024-02-14 — End: 2024-02-14
  Administered 2024-02-14: 1 via OPHTHALMIC

## 2024-02-14 MED ORDER — HYALURONIDASE HUMAN 150 UNIT/ML IJ SOLN
INTRAMUSCULAR | Status: AC
  Filled 2024-02-14: qty 1

## 2024-02-14 MED ORDER — FENTANYL CITRATE (PF) 100 MCG/2ML IJ SOLN
INTRAMUSCULAR | Status: DC | PRN
  Administered 2024-02-14: 25 ug via INTRAVENOUS

## 2024-02-14 MED ORDER — LIDOCAINE HCL 2 % IJ SOLN
INTRAMUSCULAR | Status: DC | PRN
  Administered 2024-02-14: 6 mL

## 2024-02-14 MED ORDER — LIDOCAINE HCL 2 % IJ SOLN
INTRAMUSCULAR | Status: AC
  Filled 2024-02-14: qty 20

## 2024-02-14 MED ORDER — DEXMEDETOMIDINE HCL IN NACL 80 MCG/20ML IV SOLN
INTRAVENOUS | Status: DC | PRN
  Administered 2024-02-14: 12 ug via INTRAVENOUS

## 2024-02-14 MED ORDER — BSS PLUS IO SOLN
INTRAOCULAR | Status: AC
  Filled 2024-02-14: qty 500

## 2024-02-14 MED ORDER — BSS PLUS IO SOLN
INTRAOCULAR | Status: DC | PRN
  Administered 2024-02-14: 1

## 2024-02-14 MED ORDER — SODIUM HYALURONATE 10 MG/ML IO SOLUTION
PREFILLED_SYRINGE | INTRAOCULAR | Status: AC
Start: 2024-02-14 — End: 2024-02-14
  Filled 2024-02-14: qty 0.85

## 2024-02-14 MED ORDER — PROPOFOL 10 MG/ML IV BOLUS
INTRAVENOUS | Status: DC | PRN
  Administered 2024-02-14: 40 mg via INTRAVENOUS

## 2024-02-14 MED ORDER — ALBUTEROL SULFATE HFA 108 (90 BASE) MCG/ACT IN AERS
INHALATION_SPRAY | RESPIRATORY_TRACT | Status: DC | PRN
  Administered 2024-02-14: 2 via RESPIRATORY_TRACT

## 2024-02-14 MED ORDER — DEXAMETHASONE SOD PHOSPHATE PF 10 MG/ML IJ SOLN
INTRAMUSCULAR | Status: DC | PRN
  Administered 2024-02-14: 10 mg

## 2024-02-14 MED ORDER — CHLORHEXIDINE GLUCONATE 0.12 % MT SOLN
15.0000 mL | Freq: Once | OROMUCOSAL | Status: AC
  Administered 2024-02-14: 15 mL via OROMUCOSAL
  Filled 2024-02-14: qty 15

## 2024-02-14 MED ORDER — OXYCODONE HCL 5 MG/5ML PO SOLN: 5.0000 mg | Freq: Once | ORAL | Status: DC | PRN

## 2024-02-14 MED ORDER — PHENYLEPHRINE HCL 2.5 % OP SOLN
1.0000 [drp] | OPHTHALMIC | Status: AC | PRN
  Administered 2024-02-14 (×3): 1 [drp] via OPHTHALMIC
  Filled 2024-02-14: qty 2

## 2024-02-14 MED ORDER — BSS IO SOLN
INTRAOCULAR | Status: DC | PRN
Start: 2024-02-14 — End: 2024-02-14
  Administered 2024-02-14: 15 mL

## 2024-02-14 MED ORDER — SODIUM CHLORIDE 0.9 % IV SOLN: INTRAVENOUS | Status: DC

## 2024-02-14 SURGICAL SUPPLY — 32 items
APPLICATOR COTTON TIP 6 STRL (MISCELLANEOUS) ×2 IMPLANT
BNDG EYE OVAL 2 1/8 X 2 5/8 (GAUZE/BANDAGES/DRESSINGS) IMPLANT
CANNULA VLV SOFT TIP 25G (OPHTHALMIC) ×2 IMPLANT
CLSR STERI-STRIP ANTIMIC 1/2X4 (GAUZE/BANDAGES/DRESSINGS) ×2 IMPLANT
DRAPE HALF SHEET 40X57 (DRAPES) ×2 IMPLANT
DRAPE RETRACTOR (MISCELLANEOUS) ×2 IMPLANT
GLOVE SURG SYN 7.5 PF PI (GLOVE) ×4 IMPLANT
GOWN STRL REUS W/ TWL LRG LVL3 (GOWN DISPOSABLE) ×2 IMPLANT
KIT BASIN OR (CUSTOM PROCEDURE TRAY) ×2 IMPLANT
KIT TURNOVER KIT B (KITS) ×2 IMPLANT
LENS BIOM SUPER VIEW SET DISP (MISCELLANEOUS) ×2 IMPLANT
NDL 18GX1X1/2 (RX/OR ONLY) (NEEDLE) ×2 IMPLANT
NDL 25GX 5/8IN NON SAFETY (NEEDLE) ×2 IMPLANT
NDL 27GX1/2 REG BEVEL ECLIP (NEEDLE) ×2 IMPLANT
NDL HYPO 30X.5 LL (NEEDLE) ×4 IMPLANT
NDL RETROBULBAR 25GX1.5 (NEEDLE) ×2 IMPLANT
NEEDLE 18GX1X1/2 (RX/OR ONLY) (NEEDLE) ×1 IMPLANT
NEEDLE 25GX 5/8IN NON SAFETY (NEEDLE) ×1 IMPLANT
NEEDLE 27GX1/2 REG BEVEL ECLIP (NEEDLE) ×1 IMPLANT
NEEDLE HYPO 30X.5 LL (NEEDLE) ×2 IMPLANT
NEEDLE RETROBULBAR 25GX1.5 (NEEDLE) ×1 IMPLANT
PACK VITRECTOMY CUSTOM (CUSTOM PROCEDURE TRAY) ×2 IMPLANT
PAD ARMBOARD POSITIONER FOAM (MISCELLANEOUS) ×4 IMPLANT
PAK PIK VITRECTOMY CVS 25GA (OPHTHALMIC) ×2 IMPLANT
PROBE LASER ILLUM FLEX CVD 25G (OPHTHALMIC) IMPLANT
SHIELD EYE LENSE ONLY DISP (GAUZE/BANDAGES/DRESSINGS) IMPLANT
SOLN STERILE WATER BTL 1000 ML (IV SOLUTION) ×2 IMPLANT
SOLUTION ANTFG W/FOAM PAD STRL (MISCELLANEOUS) ×2 IMPLANT
STOPCOCK 4 WAY LG BORE MALE ST (IV SETS) IMPLANT
SYR 10ML LL (SYRINGE) IMPLANT
SYR TB 1ML LUER SLIP (SYRINGE) ×2 IMPLANT
TOWEL GREEN STERILE FF (TOWEL DISPOSABLE) ×2 IMPLANT

## 2024-02-14 NOTE — Anesthesia Preprocedure Evaluation (Signed)
 Anesthesia Evaluation  Patient identified by MRN, date of birth, ID band Patient awake    Reviewed: Allergy & Precautions, NPO status , Patient's Chart, lab work & pertinent test results, reviewed documented beta blocker date and time   Airway Mallampati: III  TM Distance: >3 FB     Dental  (+) Loose, Poor Dentition, Missing   Pulmonary shortness of breath, asthma , PE (prior submassive PE)   breath sounds clear to auscultation       Cardiovascular + CAD   Rhythm:Regular Rate:Normal  Left ventricle: The cavity size was normal. Systolic function was    normal. The estimated ejection fraction was in the range of 55%    to 60%. Wall motion was normal; there were no regional wall    motion abnormalities. Doppler parameters are consistent with    abnormal left ventricular relaxation (grade 1 diastolic    dysfunction). There was no evidence of elevated ventricular    filling pressure by Doppler parameters.  - Aortic valve: There was no regurgitation.  - Mitral valve: There was trivial regurgitation.  - Right ventricle: The cavity size was severely dilated. Wall    thickness was normal. Systolic function was moderately reduced.  - Right atrium: The atrium was normal in size.  - Tricuspid valve: There was moderate regurgitation.  - Pulmonic valve: There was trivial regurgitation.  - Pulmonary arteries: Systolic pressure was mildly to moderately    increased. PA peak pressure: 41 mm Hg (S).  - Inferior vena cava: The vessel was dilated. The respirophasic    diameter changes were blunted (< 50%), consistent with elevated    central venous pressure.  - Pericardium, extracardiac: There was no pericardial effusion.     Neuro/Psych neg Seizures    GI/Hepatic ,,,(+) neg Cirrhosis        Endo/Other   Hyperthyroidism   Renal/GU Renal disease     Musculoskeletal  (+) Arthritis ,    Abdominal   Peds  Hematology   Anesthesia  Other Findings   Reproductive/Obstetrics                              Anesthesia Physical Anesthesia Plan  ASA: 3  Anesthesia Plan: MAC   Post-op Pain Management:    Induction: Intravenous  PONV Risk Score and Plan: 1 and Ondansetron and Propofol infusion  Airway Management Planned: Natural Airway and Nasal Cannula  Additional Equipment:   Intra-op Plan:   Post-operative Plan:   Informed Consent: I have reviewed the patients History and Physical, chart, labs and discussed the procedure including the risks, benefits and alternatives for the proposed anesthesia with the patient or authorized representative who has indicated his/her understanding and acceptance.     Dental advisory given  Plan Discussed with: CRNA  Anesthesia Plan Comments:          Anesthesia Quick Evaluation

## 2024-02-14 NOTE — H&P (Signed)
 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      Eyes:    Acuity OS CF   External: Within normal limits      Anterior segment: 1+ cell,     Vitreous: 4+ cell  Fundus: hazy view         Impression: Endophthalmitis left eye  Plan:  Vitrectomy with injection of intravitreal antibiotics and vitreous tap for culture  Onesimo Blanch, MD

## 2024-02-14 NOTE — Anesthesia Postprocedure Evaluation (Signed)
 Anesthesia Post Note  Patient: Barbara Wells  Procedure(s) Performed: PARS PLANA VITRECTOMY 25 GAUGE FOR ENDOPHTHALMITIS (Left: Eye)     Patient location during evaluation: PACU Anesthesia Type: MAC Level of consciousness: awake and alert Pain management: pain level controlled Vital Signs Assessment: post-procedure vital signs reviewed and stable Respiratory status: spontaneous breathing, nonlabored ventilation and respiratory function stable Cardiovascular status: stable and blood pressure returned to baseline Postop Assessment: no apparent nausea or vomiting Anesthetic complications: no   No notable events documented.  Last Vitals:  Vitals:   02/14/24 1845 02/14/24 1900  BP: (!) 114/46 (!) 118/54  Pulse: (!) 58 (!) 53  Resp: 15 (!) 21  Temp:  36.6 C  SpO2: 91% 92%    Last Pain:  Vitals:   02/14/24 1900  TempSrc:   PainSc: 0-No pain                 Jayesh Marbach,W. EDMOND

## 2024-02-14 NOTE — Brief Op Note (Signed)
 02/14/2024  6:23 PM  PATIENT:  Barbara Wells  68 y.o. female  PRE-OPERATIVE DIAGNOSIS: Endophthalmitis and vitreous opacities left eye  POST-OPERATIVE DIAGNOSIS: Endophthalmitis and vitreous opacities left eye  PROCEDURE:  Procedure(s) with comments: PARS PLANA VITRECTOMY 25 GAUGE FOR ENDOPHTHALMITIS/VITREOUS OPACITIES, VITREOUS TAP FOR CULTURE, ENDOLASER AND INJECTION OF INTRAVITRAEL ANTIBOTICS  SURGEON:  Surgeons and Role:    * Tobie Baptist, MD - Primary  PHYSICIAN ASSISTANT:   ASSISTANTS: none   ANESTHESIA:   local and MAC  EBL:  MINIMAL   BLOOD ADMINISTERED:none  DRAINS: none   LOCAL MEDICATIONS USED:  MARCAINE    and LIDOCAINE   SPECIMEN:  Source of Specimen:  VITREOUS  DISPOSITION OF SPECIMEN:  MICROBIOLOGY  COUNTS:  YES  TOURNIQUET:  * No tourniquets in log *  DICTATION: .Note written in EPIC  PLAN OF CARE: Discharge to home after PACU  PATIENT DISPOSITION:  PACU - hemodynamically stable.   Delay start of Pharmacological VTE agent (>24hrs) due to surgical blood loss or risk of bleeding: not applicable

## 2024-02-14 NOTE — Transfer of Care (Signed)
 Immediate Anesthesia Transfer of Care Note  Patient: Barbara Wells  Procedure(s) Performed: PARS PLANA VITRECTOMY 25 GAUGE FOR ENDOPHTHALMITIS (Left: Eye)  Patient Location: PACU  Anesthesia Type:MAC combined with regional for post-op pain  Level of Consciousness: awake, alert , and oriented  Airway & Oxygen Therapy: Patient Spontanous Breathing  Post-op Assessment: Report given to RN, Post -op Vital signs reviewed and stable, and Patient moving all extremities X 4  Post vital signs: Reviewed and stable  Last Vitals:  Vitals Value Taken Time  BP 120/49 02/14/24 18:27  Temp    Pulse 66 02/14/24 18:30  Resp 16 02/14/24 18:30  SpO2 92 % 02/14/24 18:30  Vitals shown include unfiled device data.  Last Pain:  Vitals:   02/14/24 1443  TempSrc:   PainSc: 3       Patients Stated Pain Goal: 2 (02/14/24 1443)  Complications: No notable events documented.

## 2024-02-14 NOTE — Discharge Instructions (Addendum)
DO NOT SLEEP ON BACK, THE EYE PRESSURE CAN GO UP AND CAUSE VISION LOSS   SLEEP ON SIDE WITH NOSE TO PILLOW  DURING DAY KEEP UPRIGHT 

## 2024-02-15 ENCOUNTER — Encounter (HOSPITAL_COMMUNITY): Payer: Self-pay | Admitting: Ophthalmology

## 2024-02-19 LAB — AEROBIC/ANAEROBIC CULTURE W GRAM STAIN (SURGICAL/DEEP WOUND)
Culture: NO GROWTH
Gram Stain: NONE SEEN

## 2024-04-01 NOTE — Op Note (Signed)
 Barbara Wells 02/14/2024 Diagnosis: Endophthalmitis and vitrous opacities left eye  Procedure: Pars Plana Vitrectomy, Endolaser, and tap for culture with injection of intravitreal antibiotics Operative Eye:  left eye  Surgeon: Onesimo Brendolyn Blanch Estimated Blood Loss: minimal Specimens for Pathology:  None Complications: none  Time out confirmed the correct operative eye as the left eye and retrobulbar anesthesia was administred  The  patient was prepped and draped in the usual fashion for ocular surgery on the  left eye.  A lid speculum was placed.  Infusion line and trocar was placed at the 4 o'clock position approximately 3.5 mm from the surgical limbus.   The infusion line was allowed to run and then clamped when placed at the cannula opening. The line was inserted and secured to the drape with an adhesive strip.   Active trocars/cannula were placed at the 10 and 2 o'clock positions approximately 3.5 mm from the surgical limbus. The cannula was visualized in the vitreous cavity.  The light pipe and vitreous cutter were inserted into the vitreous cavity and a vitreous biopsy was obtained and sent for gram stain and culture.  The core vitrectomy was then performed.  Care taken to remove the vitreous up to the vitreous base for 360 degrees.  The ivitroeus opactieis were removed from the eye,  No retinal hemorrhage or vaascular sheathing was noted.    3 rows of endolaser were applied 360 degrees to the periphery.  Intravitreal Vancomycin  1mg  in 0.4ml and Caftazadime 2.25mg  in 0.74ml were injected in the vitreous space.  A partial air-fluid exchange was performed.  The superior cannulas were sequentially removed with concommitant tamponade using a cotton tipped applicator and noted to be air tight.  The infusion line and trocar were removed and the sclerotomy was noted to be air tight with normal intraocular pressure by digital palpapation.  Subconjunctival injections of  Vacomycin and  Dexamethasone  4mg /55ml was placed in the infero-medial quadrant.   The speculum and drapes were removed and the eye was patched with Polymixin/Bacitracin ophthalmic ointment. An eye shield was placed and the patient was transferred alert and conversant with stable vital signs to the post operative recovery area.  The patient tolerated the procedure well and no complications were noted.  Onesimo Brendolyn Blanch MD

## 2024-08-05 ENCOUNTER — Encounter
